# Patient Record
Sex: Female | Born: 1960 | Race: Black or African American | Hispanic: No | Marital: Married | Smoking: Current every day smoker
Health system: Southern US, Community
[De-identification: ages and names within clinical notes are randomized; demographics above are authoritative.]

## PROBLEM LIST (undated history)

## (undated) DIAGNOSIS — I10 Essential (primary) hypertension: Secondary | ICD-10-CM

## (undated) DIAGNOSIS — J45909 Unspecified asthma, uncomplicated: Secondary | ICD-10-CM

## (undated) DIAGNOSIS — E119 Type 2 diabetes mellitus without complications: Secondary | ICD-10-CM

## (undated) DIAGNOSIS — K219 Gastro-esophageal reflux disease without esophagitis: Secondary | ICD-10-CM

## (undated) DIAGNOSIS — J449 Chronic obstructive pulmonary disease, unspecified: Secondary | ICD-10-CM

## (undated) HISTORY — PX: ANKLE SURGERY: SHX546

## (undated) HISTORY — PX: DENTAL SURGERY: SHX609

---

## 2004-10-11 ENCOUNTER — Emergency Department: Payer: Self-pay | Admitting: Emergency Medicine

## 2006-02-23 ENCOUNTER — Other Ambulatory Visit: Payer: Self-pay

## 2006-02-23 ENCOUNTER — Inpatient Hospital Stay: Payer: Self-pay | Admitting: Internal Medicine

## 2006-06-13 ENCOUNTER — Emergency Department: Payer: Self-pay | Admitting: Emergency Medicine

## 2009-10-28 ENCOUNTER — Emergency Department: Payer: Self-pay | Admitting: Emergency Medicine

## 2011-01-18 ENCOUNTER — Emergency Department: Payer: Self-pay | Admitting: Emergency Medicine

## 2011-06-03 ENCOUNTER — Emergency Department: Payer: Self-pay | Admitting: Emergency Medicine

## 2012-04-18 LAB — CBC WITH DIFFERENTIAL/PLATELET
Basophil #: 0 10*3/uL (ref 0.0–0.1)
Basophil %: 0.3 %
Eosinophil #: 0.2 10*3/uL (ref 0.0–0.7)
Eosinophil %: 2.8 %
HCT: 44.9 % (ref 35.0–47.0)
HGB: 14.7 g/dL (ref 12.0–16.0)
Lymphocyte #: 1.8 10*3/uL (ref 1.0–3.6)
Lymphocyte %: 30.6 %
MCHC: 32.6 g/dL (ref 32.0–36.0)
MCV: 101 fL — ABNORMAL HIGH (ref 80–100)
Neutrophil #: 3.5 10*3/uL (ref 1.4–6.5)
Neutrophil %: 60.6 %
Platelet: 204 10*3/uL (ref 150–440)
RDW: 13.7 % (ref 11.5–14.5)
WBC: 5.8 10*3/uL (ref 3.6–11.0)

## 2012-04-18 LAB — DRUG SCREEN, URINE
Amphetamines, Ur Screen: NEGATIVE (ref ?–1000)
Barbiturates, Ur Screen: NEGATIVE (ref ?–200)
Benzodiazepine, Ur Scrn: NEGATIVE (ref ?–200)
Cannabinoid 50 Ng, Ur ~~LOC~~: NEGATIVE (ref ?–50)
Cocaine Metabolite,Ur ~~LOC~~: POSITIVE (ref ?–300)
Opiate, Ur Screen: NEGATIVE (ref ?–300)
Tricyclic, Ur Screen: NEGATIVE (ref ?–1000)

## 2012-04-18 LAB — HEMOGLOBIN A1C: Hemoglobin A1C: 6.4 % — ABNORMAL HIGH (ref 4.2–6.3)

## 2012-04-18 LAB — BASIC METABOLIC PANEL
Anion Gap: 8 (ref 7–16)
BUN: 9 mg/dL (ref 7–18)
Calcium, Total: 8.5 mg/dL (ref 8.5–10.1)
Co2: 24 mmol/L (ref 21–32)
Creatinine: 0.98 mg/dL (ref 0.60–1.30)
EGFR (African American): >60
EGFR (Non-African Amer.): >60
Glucose: 93 mg/dL (ref 65–99)
Potassium: 3.7 mmol/L (ref 3.5–5.1)

## 2012-04-18 LAB — URINALYSIS, COMPLETE
Glucose,UR: NEGATIVE mg/dL (ref 0–75)
Ketone: NEGATIVE
Squamous Epithelial: 3
WBC UR: 1 /HPF (ref 0–5)

## 2012-04-18 LAB — HEPATIC FUNCTION PANEL A (ARMC)
Albumin: 3.1 g/dL — ABNORMAL LOW (ref 3.4–5.0)
Alkaline Phosphatase: 86 U/L (ref 50–136)
SGPT (ALT): 18 U/L

## 2012-04-18 LAB — TROPONIN I: Troponin-I: <0.02 ng/mL

## 2012-04-18 LAB — CK TOTAL AND CKMB (NOT AT ARMC): CK-MB: 0.9 ng/mL (ref 0.5–3.6)

## 2012-04-18 LAB — TSH: Thyroid Stimulating Horm: 0.781 u[IU]/mL

## 2012-04-19 ENCOUNTER — Inpatient Hospital Stay: Payer: Self-pay | Admitting: Student

## 2012-04-19 LAB — CBC WITH DIFFERENTIAL/PLATELET
Basophil #: 0 10*3/uL (ref 0.0–0.1)
Eosinophil %: 2.1 %
HCT: 44.8 % (ref 35.0–47.0)
HGB: 14.3 g/dL (ref 12.0–16.0)
Lymphocyte #: 2.2 10*3/uL (ref 1.0–3.6)
Lymphocyte %: 20.9 %
MCH: 32.5 pg (ref 26.0–34.0)
Monocyte #: 0.9 x10 3/mm (ref 0.2–0.9)
Monocyte %: 8.1 %
Neutrophil #: 7.2 10*3/uL — ABNORMAL HIGH (ref 1.4–6.5)
Neutrophil %: 68.6 %
Platelet: 200 10*3/uL (ref 150–440)
RDW: 13.6 % (ref 11.5–14.5)
WBC: 10.5 10*3/uL (ref 3.6–11.0)

## 2012-04-19 LAB — LIPID PANEL
Cholesterol: 204 mg/dL — ABNORMAL HIGH (ref 0–200)
HDL Cholesterol: 44 mg/dL (ref 40–60)
Ldl Cholesterol, Calc: 136 mg/dL — ABNORMAL HIGH (ref 0–100)
Triglycerides: 119 mg/dL (ref 0–200)
VLDL Cholesterol, Calc: 24 mg/dL (ref 5–40)

## 2012-04-19 LAB — BASIC METABOLIC PANEL
BUN: 11 mg/dL (ref 7–18)
Chloride: 107 mmol/L (ref 98–107)
Co2: 27 mmol/L (ref 21–32)
Creatinine: 0.94 mg/dL (ref 0.60–1.30)
Potassium: 4.5 mmol/L (ref 3.5–5.1)

## 2012-04-22 LAB — WOUND AEROBIC CULTURE

## 2015-04-13 NOTE — H&P (Signed)
PATIENT NAME:  Aimee Buck, Aimee Buck MR#:  [id number] DATE OF BIRTH:  03/04/1961  DATE OF ADMISSION:  04/18/2012  PRIMARY CARE PHYSICIAN: Casilda Carls, MD (although the patient has not seen Dr. Rosario Jacks for more than a year)  HISTORY OF PRESENT ILLNESS: The patient is a 54 year old African American female with past medical history significant for history of hypertension, history of osteoarthritis, and history of obesity who presented to the hospital with complaints of right upper extremity swelling. According to the patient, she was doing well up until yesterday when she started having swelling and tightness in her right hand. She denies any kind of injury, but admits of scratching her right hand because of itching, in the dorsal aspect of her hand. Today however her tightness as well as swelling and redness extended to her arm. She denies any fevers, but admits of chills for the past one week. On arrival to the emergency room, she was noted to have a swollen hand, in the dorsal aspect of her hand, with weeping wounds as well as thickened skin and bullous, and hospitalist services were contacted for admission.   PAST MEDICAL HISTORY:  1. History of hypertension. 2. History of osteoarthritis in knees. 3. Admitted apparently in 2007 for overdose with acetaminophen, also history of hypokalemia and hyperglycemia. Also at that time she also abused cocaine as well as alcohol and tobacco.  4. History of depression.   MEDICATIONS: None.   ALLERGIES: Penicillin.  PAST SURGICAL HISTORY: Surgery of left ankle with three screws placed approximately 25 to 30 years ago.  FAMILY HISTORY: Diabetes mellitus as well as osteoarthritis in the patient's mother. The patient's father died at the age of 69 to 51 of massive myocardial infarction. History of unknown cancer in the patient's maternal grandmother as well as great-grandmother.   SOCIAL HISTORY: The patient is married and has three children. She lives with her  husband. She has smoked one pack per day for more than 25 years. She drinks alcohol, approximately 6 beers a week. 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  REVIEW OF SYSTEMS: Positive for feeling chilly, fatigue and weak for the past few days, itching in her hands and swelling as well as redness of her hands, weeping wounds in her hand as well as arm, redness of her arm, nausea and vomiting Sunday morning, and gastroesophageal reflux disease. CONSTITUTIONAL: Denies any fevers, pain, weight gain or weight loss. EYES: Denies blurry vision, double vision, glaucoma, or cataracts. ABDOMEN: Denies tinnitus, allergies, epistaxis, sinus pain, dentures or difficulty swallowing. RESPIRATORY: Denies any wheezing, asthma or chronic obstructive pulmonary disease. CARDIOVASCULAR: Denies chest pain, orthopnea, edema, arrhythmias, palpitations, or syncope. GASTROINTESTINAL: Denies any diarrhea, abdominal pain, rectal bleeding, or change in bowel habits. GENITOURINARY: Denies any dysuria, hematuria, frequency, or incontinence. ENDOCRINOLOGY: Denies any polydipsia, nocturia, thyroid problems, heat or cold intolerance or thirst. HEMATOLOGIC: Denies any anemia, bruising, bleeding or swollen glands. SKIN: Denies acne, rashes, lesions, or change in moles. MUSCULOSKELETAL: Denies arthritis, cramps, swelling, or gout. NEUROLOGIC: Denies numbness, epilepsy, or tremor. PSYCHIATRIC: Denies anxiety, insomnia, or depression.   PHYSICAL EXAMINATION:   VITALS: On arrival to the hospital, her temperature was 98.3, pulse 121, respiratory rate 22, and blood pressure 177/111. Oxygenation was not checked.   GENERAL:  An obese African American female in no significant distress lying on the stretcher eating crackers and peanut butter.  HEENT: Pupils are equally round and reactive to light and accommodation. Extraocular movements are intact. No icterus or conjunctivitis. Has normal  hearing. No pharyngeal erythema. Mucosa is moist.    NECK: No masses. Supple and nontender. Thyroid is not enlarged. No adenopathy. No JVD or carotid bruits bilaterally. Full range of motion.   LUNGS: Clear to auscultation in all fields. No rales, rhonchi, diminished breath sounds or labored breathing, increased effort, dullness to percussion, or overt respiratory distress.   HEART: S1 and S2 appreciated. No murmurs, gallops, or rubs noted. PMI not lateralized. Chest is nontender to palpation.   EXTREMITIES: 1+ pedal pulses. No lower extremity edema, calf tenderness, or cyanosis.   ABDOMEN: Soft and nontender. Bowel sounds are present. No hepatomegaly or masses were noted.   RECTAL: Deferred.   MUSCULOSKELETAL: Able to move all extremities. No cyanosis, degenerative joint disease, or kyphosis. Gait was not tested.  SKIN: Right upper extremity was swollen, it is erythematous and blotchy. There is an erythematous rash going into her axillary area. She has swelling and increased warmth as well as drainage and thickness of the dorsal aspect of her hand. Also serous bullous and serous drainage. She had an ulcer on the right buttock as well as some scratch marks ans well as erythema and scratch marks on the left shoulder area posteriorly. Skin otherwise did not show any rashes, lesions, erythema, nodularity, or induration. It was warm and dry to palpation. The patient had some brownish discoloration and chronic scratch areas.   NEUROLOGICAL: Cranial nerves grossly intact. Sensory is intact. No dysarthria or aphasia.   PSYCH: The patient is alert and oriented to time, person, and place. Cooperative. Memory is good. No significant confusion, agitation, or depression.   LABS/STUDIES: BMP is within normal limits. CBC is within normal limits except for MCV is high at 101.  ASSESSMENT AND PLAN:  1. Right upper extremity cellulitis: Admit the patient to the medical floor. Get wound cultures. Start on clindamycin. Follow the patient's clinical status. Add  dose of Benadryl for itching.  2. Malignant hypertension: Start Norvasc. Follow blood pressure readings.  3. Obesity: Get hemoglobin A1c as well as TSH and lipid panel.  4. History of alcohol abuse: Watch for withdrawal.  5. History of tobacco abuse: Discussed with the patient for 5 minutes. She refuses any replacement therapy.   6. Elevated MCV: Get folic acid as well as B84 levels. Supplement vitamin B12 as well as thiamine because of history of alcohol abuse.   TIME SPENT: One hour. ____________________________ Theodoro Grist, MD rv:slb D: 04/18/2012 11:00:25 ET T: 04/18/2012 11:33:43 ET JOB#: [id number]  cc: Theodoro Grist, MD, <Dictator> Isla Pence, MD [place] MD ELECTRONICALLY SIGNED 04/29/2012 14:17

## 2015-04-13 NOTE — Discharge Summary (Signed)
PATIENT NAME:  Aimee Buck, Aimee Buck MR#:  [id number] DATE OF BIRTH:  Oct 14, 1961  DATE OF ADMISSION:  04/19/2012 DATE OF DISCHARGE:  04/20/2012  PRIMARY CARE PHYSICIAN: Dr. Rosario Jacks  CHIEF COMPLAINT: Right hand pain and swelling.   DISCHARGE DIAGNOSES:  1. Right had cellulitis. 2. Hypertension. 3. History of polysubstance abuse. 4. Gastroesophageal reflux disease.   DISCHARGE MEDICATIONS:  1. Clindamycin 300 mg p.o. every six hours for seven days.  2. Amlodipine 10 mg daily.  3. Lisinopril 10 mg daily.  4. Zantac 150 mg p.o. b.i.d.   DIET: Low sodium, ADA diet.   ACTIVITY: As tolerated.   FOLLOW UP: Please follow up with your PCP in about a week.   DISPOSITION: Home.   HISTORY OF PRESENT ILLNESS: For full details, please see the history and physical dictated by Dr. Ether Griffins on 04/18/2012, but briefly this is a 54 year old African American female with history of hypertension, osteoarthritis who presented with right hand cellulitis. She was admitted to the hospitalist service. She was noted to have swollen, erythematous, weeping hand with some bulla. She was started on clindamycin, admitted to the hospitalist service.   LABORATORY, DIAGNOSTIC AND RADIOLOGICAL DATA: BUN on arrival 9, creatinine 0.98, glucose 93, hemoglobin A1c 6.4. Urine toxicology positive for cocaine. Troponin negative. TSH 0.781. WBC on arrival 5.8. Wound culture showing rare staph aureus and coag-negative staph. Urinalysis not suggestive of infection. Vitamin B12 level within normal limits.   HOSPITAL COURSE: She was admitted to the hospitalist service and started on IV clindamycin as she has allergy to penicillin. Patient stated that she was in her garden and possibly had an insect bite and afterwards she was itching and developed redness and cellulitis. She did have an area of cellulitis on the dorsum of her hand with significant edema, warmth and tenderness. Initially some drainage was noted. Blood cultures were not  sent while in the ED, however, wound cultures were sent from a superficial swab, however, I don't know the reliability of cultures given that it was superficial skin culture. It is showing coag-negative staph and staph aureus, however, patient has clinically dramatically improved with decreased edema, tenderness and erythema. She will be discharged with an additional seven days of clindamycin p.o. Patient also did have elevated blood pressure on arrival, however, does not take any blood pressure medications at home. She has history of noncompliance. She was started on amlodipine as well as lisinopril and currently blood pressures were stable. At this point she will be discharged to home with above medicine. She was told to follow up with her primary care physician and if she has worsening symptoms including pain, swelling, drainage, fevers please call your primary care physician right away and she verbalized understanding.   TOTAL TIME SPENT: 35 minutes.   CODE STATUS: Patient is FULL CODE.   ____________________________ Vivien Presto, MD sa:cms D: 04/20/2012 11:03:50 ET T: 04/21/2012 10:05:22 ET JOB#: [id number]  cc: Vivien Presto, MD, <Dictator> Isla Pence, MD Vivien Presto MD ELECTRONICALLY SIGNED 05/03/2012 17:55

## 2016-10-14 ENCOUNTER — Inpatient Hospital Stay
Admission: EM | Admit: 2016-10-14 | Discharge: 2016-10-16 | DRG: 638 | Disposition: A | Discharge status: Home / Self Care | Payer: Self-pay | Attending: Internal Medicine | Admitting: Internal Medicine

## 2016-10-14 ENCOUNTER — Encounter: Payer: Self-pay | Admitting: Emergency Medicine

## 2016-10-14 DIAGNOSIS — E1165 Type 2 diabetes mellitus with hyperglycemia: Principal | ICD-10-CM | POA: Diagnosis present

## 2016-10-14 DIAGNOSIS — Z79899 Other long term (current) drug therapy: Secondary | ICD-10-CM

## 2016-10-14 DIAGNOSIS — Z6841 Body Mass Index (BMI) 40.0 and over, adult: Secondary | ICD-10-CM

## 2016-10-14 DIAGNOSIS — J45909 Unspecified asthma, uncomplicated: Secondary | ICD-10-CM | POA: Diagnosis present

## 2016-10-14 DIAGNOSIS — N3001 Acute cystitis with hematuria: Secondary | ICD-10-CM | POA: Diagnosis present

## 2016-10-14 DIAGNOSIS — R739 Hyperglycemia, unspecified: Secondary | ICD-10-CM | POA: Diagnosis present

## 2016-10-14 DIAGNOSIS — I10 Essential (primary) hypertension: Secondary | ICD-10-CM | POA: Diagnosis present

## 2016-10-14 DIAGNOSIS — E871 Hypo-osmolality and hyponatremia: Secondary | ICD-10-CM | POA: Diagnosis present

## 2016-10-14 DIAGNOSIS — Z23 Encounter for immunization: Secondary | ICD-10-CM

## 2016-10-14 DIAGNOSIS — F17210 Nicotine dependence, cigarettes, uncomplicated: Secondary | ICD-10-CM | POA: Diagnosis present

## 2016-10-14 DIAGNOSIS — N179 Acute kidney failure, unspecified: Secondary | ICD-10-CM | POA: Diagnosis present

## 2016-10-14 HISTORY — DX: Unspecified asthma, uncomplicated: J45.909

## 2016-10-14 HISTORY — DX: Essential (primary) hypertension: I10

## 2016-10-14 LAB — CBC
HCT: 45.1 % (ref 35.0–47.0)
HEMOGLOBIN: 15.2 g/dL (ref 12.0–16.0)
MCH: 34.2 pg — AB (ref 26.0–34.0)
MCHC: 33.8 g/dL (ref 32.0–36.0)
MCV: 101.1 fL — ABNORMAL HIGH (ref 80.0–100.0)
Platelets: 216 10*3/uL (ref 150–440)
RBC: 4.46 MIL/uL (ref 3.80–5.20)
RDW: 13.1 % (ref 11.5–14.5)
WBC: 6.4 10*3/uL (ref 3.6–11.0)

## 2016-10-14 LAB — URINALYSIS COMPLETE WITH MICROSCOPIC (ARMC ONLY)
Bilirubin Urine: NEGATIVE
Glucose, UA: >500 mg/dL — AB
KETONES UR: NEGATIVE mg/dL
NITRITE: POSITIVE — AB
PH: 6 (ref 5.0–8.0)
PROTEIN: NEGATIVE mg/dL
SPECIFIC GRAVITY, URINE: 1.023 (ref 1.005–1.030)

## 2016-10-14 LAB — TSH: TSH: 1.105 u[IU]/mL (ref 0.350–4.500)

## 2016-10-14 LAB — GLUCOSE, CAPILLARY
GLUCOSE-CAPILLARY: 209 mg/dL — AB (ref 65–99)
GLUCOSE-CAPILLARY: 248 mg/dL — AB (ref 65–99)
GLUCOSE-CAPILLARY: 473 mg/dL — AB (ref 65–99)
GLUCOSE-CAPILLARY: 525 mg/dL — AB (ref 65–99)
Glucose-Capillary: 569 mg/dL (ref 65–99)
Glucose-Capillary: 577 mg/dL (ref 65–99)
Glucose-Capillary: 427 mg/dL — ABNORMAL HIGH (ref 65–99)
Glucose-Capillary: 117 mg/dL — ABNORMAL HIGH (ref 65–99)

## 2016-10-14 LAB — MRSA PCR SCREENING: MRSA BY PCR: NEGATIVE

## 2016-10-14 LAB — BASIC METABOLIC PANEL
ANION GAP: 12 (ref 5–15)
ANION GAP: 10 (ref 5–15)
BUN: 29 mg/dL — ABNORMAL HIGH (ref 6–20)
BUN: 27 mg/dL — AB (ref 6–20)
CALCIUM: 9 mg/dL (ref 8.9–10.3)
CALCIUM: 8.7 mg/dL — AB (ref 8.9–10.3)
CO2: 24 mmol/L (ref 22–32)
CO2: 25 mmol/L (ref 22–32)
CREATININE: 2.16 mg/dL — AB (ref 0.44–1.00)
Chloride: 85 mmol/L — ABNORMAL LOW (ref 101–111)
Chloride: 94 mmol/L — ABNORMAL LOW (ref 101–111)
Creatinine, Ser: 2.61 mg/dL — ABNORMAL HIGH (ref 0.44–1.00)
GFR calc Af Amer: 29 mL/min — ABNORMAL LOW (ref >60)
GFR, EST AFRICAN AMERICAN: 23 mL/min — AB (ref >60)
GFR, EST NON AFRICAN AMERICAN: 20 mL/min — AB (ref >60)
GFR, EST NON AFRICAN AMERICAN: 25 mL/min — AB (ref >60)
GLUCOSE: 451 mg/dL — AB (ref 65–99)
Glucose, Bld: 878 mg/dL (ref 65–99)
Potassium: 4.8 mmol/L (ref 3.5–5.1)
Potassium: 3.8 mmol/L (ref 3.5–5.1)
Sodium: 121 mmol/L — ABNORMAL LOW (ref 135–145)
Sodium: 129 mmol/L — ABNORMAL LOW (ref 135–145)

## 2016-10-14 LAB — HEPATIC FUNCTION PANEL
ALBUMIN: 4 g/dL (ref 3.5–5.0)
ALT: 17 U/L (ref 14–54)
AST: 20 U/L (ref 15–41)
Alkaline Phosphatase: 174 U/L — ABNORMAL HIGH (ref 38–126)
BILIRUBIN TOTAL: 0.4 mg/dL (ref 0.3–1.2)
Bilirubin, Direct: 0.1 mg/dL (ref 0.1–0.5)
Indirect Bilirubin: 0.3 mg/dL (ref 0.3–0.9)
Total Protein: 7.6 g/dL (ref 6.5–8.1)

## 2016-10-14 LAB — LIPASE, BLOOD: Lipase: 58 U/L — ABNORMAL HIGH (ref 11–51)

## 2016-10-14 MED ORDER — CEFTRIAXONE SODIUM-DEXTROSE 1-3.74 GM-% IV SOLR
1.0000 g | Freq: Once | INTRAVENOUS | Status: AC
  Administered 2016-10-14: 1 g via INTRAVENOUS
  Filled 2016-10-14: qty 50

## 2016-10-14 MED ORDER — INSULIN REGULAR BOLUS VIA INFUSION
0.0000 [IU] | Freq: Three times a day (TID) | INTRAVENOUS | Status: DC
  Filled 2016-10-14: qty 10

## 2016-10-14 MED ORDER — DEXTROSE 50 % IV SOLN: 25.0000 mL | INTRAVENOUS | Status: DC | PRN

## 2016-10-14 MED ORDER — ONDANSETRON HCL 4 MG/2ML IJ SOLN: 4.0000 mg | Freq: Four times a day (QID) | INTRAMUSCULAR | Status: DC | PRN

## 2016-10-14 MED ORDER — SODIUM CHLORIDE 0.9 % IV BOLUS (SEPSIS)
1000.0000 mL | Freq: Once | INTRAVENOUS | Status: AC
  Administered 2016-10-14: 1000 mL via INTRAVENOUS

## 2016-10-14 MED ORDER — ACETAMINOPHEN 650 MG RE SUPP: 650.0000 mg | Freq: Four times a day (QID) | RECTAL | Status: DC | PRN

## 2016-10-14 MED ORDER — ONDANSETRON HCL 4 MG PO TABS: 4.0000 mg | ORAL_TABLET | Freq: Four times a day (QID) | ORAL | Status: DC | PRN

## 2016-10-14 MED ORDER — INSULIN ASPART 100 UNIT/ML ~~LOC~~ SOLN
10.0000 [IU] | Freq: Once | SUBCUTANEOUS | Status: DC
  Filled 2016-10-14: qty 10

## 2016-10-14 MED ORDER — SODIUM CHLORIDE 0.9 % IV SOLN: INTRAVENOUS | Status: DC

## 2016-10-14 MED ORDER — SODIUM CHLORIDE 0.9 % IV SOLN
INTRAVENOUS | Status: DC
  Administered 2016-10-14 – 2016-10-15 (×2): via INTRAVENOUS

## 2016-10-14 MED ORDER — GABAPENTIN 100 MG PO CAPS
200.0000 mg | ORAL_CAPSULE | Freq: Two times a day (BID) | ORAL | Status: DC
  Administered 2016-10-14 – 2016-10-16 (×4): 200 mg via ORAL
  Filled 2016-10-14 (×4): qty 2

## 2016-10-14 MED ORDER — CEFTRIAXONE SODIUM-DEXTROSE 1-3.74 GM-% IV SOLR
1.0000 g | INTRAVENOUS | Status: DC
  Filled 2016-10-14: qty 50

## 2016-10-14 MED ORDER — SODIUM CHLORIDE 0.9 % IV SOLN
INTRAVENOUS | Status: DC
  Filled 2016-10-14: qty 2.5

## 2016-10-14 MED ORDER — INFLUENZA VAC SPLIT QUAD 0.5 ML IM SUSY
0.5000 mL | PREFILLED_SYRINGE | INTRAMUSCULAR | Status: AC
  Administered 2016-10-15: 0.5 mL via INTRAMUSCULAR
  Filled 2016-10-14: qty 0.5

## 2016-10-14 MED ORDER — DEXTROSE-NACL 5-0.45 % IV SOLN
INTRAVENOUS | Status: DC
  Administered 2016-10-14 – 2016-10-15 (×2): via INTRAVENOUS

## 2016-10-14 MED ORDER — SODIUM CHLORIDE 0.9% FLUSH
3.0000 mL | Freq: Two times a day (BID) | INTRAVENOUS | Status: DC
  Administered 2016-10-14 – 2016-10-16 (×4): 3 mL via INTRAVENOUS

## 2016-10-14 MED ORDER — INSULIN ASPART 100 UNIT/ML ~~LOC~~ SOLN
10.0000 [IU] | Freq: Once | SUBCUTANEOUS | Status: AC
  Administered 2016-10-14: 10 [IU] via INTRAVENOUS

## 2016-10-14 MED ORDER — ACETAMINOPHEN 325 MG PO TABS
650.0000 mg | ORAL_TABLET | Freq: Four times a day (QID) | ORAL | Status: DC | PRN
Start: 2016-10-14 — End: 2016-10-16

## 2016-10-14 MED ORDER — INSULIN REGULAR BOLUS VIA INFUSION
0.0000 [IU] | Freq: Three times a day (TID) | INTRAVENOUS | Status: DC
  Administered 2016-10-15: 0.3 [IU] via INTRAVENOUS
  Administered 2016-10-15: 5 [IU] via INTRAVENOUS
  Filled 2016-10-14: qty 10

## 2016-10-14 MED ORDER — DEXTROSE 5 % IV SOLN: 1.0000 g | Freq: Once | INTRAVENOUS | Status: DC

## 2016-10-14 MED ORDER — INSULIN REGULAR HUMAN 100 UNIT/ML IJ SOLN
INTRAMUSCULAR | Status: DC
  Administered 2016-10-14: 4.7 [IU]/h via INTRAVENOUS
  Administered 2016-10-14: 13.5 [IU]/h via INTRAVENOUS
  Administered 2016-10-15: 2.1 [IU]/h via INTRAVENOUS
  Administered 2016-10-15: 19 [IU]/h via INTRAVENOUS
  Administered 2016-10-15: 3 [IU]/h via INTRAVENOUS
  Filled 2016-10-14 (×2): qty 2.5

## 2016-10-14 MED ORDER — CEPHALEXIN 500 MG PO CAPS: 500.0000 mg | ORAL_CAPSULE | Freq: Four times a day (QID) | ORAL | 0 refills | Status: DC

## 2016-10-14 MED ORDER — ENOXAPARIN SODIUM 40 MG/0.4ML ~~LOC~~ SOLN
40.0000 mg | SUBCUTANEOUS | Status: DC
  Administered 2016-10-14 – 2016-10-15 (×2): 40 mg via SUBCUTANEOUS
  Filled 2016-10-14 (×2): qty 0.4

## 2016-10-14 MED ORDER — CEPHALEXIN 500 MG PO CAPS: 500.0000 mg | ORAL_CAPSULE | Freq: Once | ORAL | Status: DC

## 2016-10-14 NOTE — ED Triage Notes (Signed)
Pt to ED c/o urinary frequency, burning, orange tinged, and swollen genitalia x2 weeks.  Pt taking OTC Phenazopyridine.  Pt c/o hyperglycemia and dizziness since Monday.  Pt states feels "like I'm going to pass out".  Pt A&Ox4, speaking in complete and coherent sentences, chest rise even and unlabored.  Pt BP taken in triage left arm 95/50, 101/65 taken multiple times and right arm 99/62.

## 2016-10-14 NOTE — H&P (Signed)
[facility] [place] NAME: Aimee Buck    MR#:  [id number]  DATE OF BIRTH:  [birth year]  DATE OF ADMISSION:  10/14/2016  PRIMARY CARE PHYSICIAN: Casilda Carls, MD   REQUESTING/REFERRING PHYSICIAN: Dr. Merlyn Lot  CHIEF COMPLAINT:   Chief Complaint  Patient presents with  . Urinary Frequency  . Dizziness    HISTORY OF PRESENT ILLNESS:  Aimee Buck  is a 55 y.o. female with a known history of asthma, hypertension comes to hospital with 2 week history of urinary frequency and dysuria.  Patient does not have a PCP and hasn't been taking care of herself due to financial limitations. She had the symptoms for almost 2 weeks but didn't want to come to ER due to the same. However symptoms were getting worse, she now has chills, nausea, generalized weakness, fatigue and came over. Blood sugars were >800, noted to have acute renal failure and UTI. Has been having fatigue, polyuria, polydipsia for a few months now. Initially did not want to get admitted. Wanted to get just antibiotic prescription and wanted to leave. After explaining the risks and complications, agreed to stay for a day only.  PAST MEDICAL HISTORY:   Past Medical History:  Diagnosis Date  . Asthma   . Hypertension     PAST SURGICAL HISTORY:   Past Surgical History:  Procedure Laterality Date  . ANKLE SURGERY Left     SOCIAL HISTORY:   Social History  Substance Use Topics  . Smoking status: Current Every Day Smoker    Packs/day: 0.50    Types: Cigarettes  . Smokeless tobacco: Never Used  . Alcohol use 8.4 oz/week    14 Cans of beer per week    FAMILY HISTORY:   Family History  Problem Relation Age of Onset  . Hypertension Mother   . Hypertension Father   . CAD Father     DRUG ALLERGIES:  No Known Allergies  REVIEW OF SYSTEMS:   Review of Systems  Constitutional: Positive for chills and malaise/fatigue. Negative for fever and weight  loss.  HENT: Negative for ear discharge, ear pain, hearing loss, nosebleeds and tinnitus.   Eyes: Negative for blurred vision, double vision and photophobia.  Respiratory: Negative for cough, hemoptysis, shortness of breath and wheezing.   Cardiovascular: Negative for chest pain, palpitations, orthopnea and leg swelling.  Gastrointestinal: Positive for nausea. Negative for abdominal pain, constipation, diarrhea, heartburn, melena and vomiting.  Genitourinary: Positive for dysuria, frequency and urgency. Negative for hematuria.  Musculoskeletal: Positive for back pain. Negative for myalgias and neck pain.  Skin: Negative for rash.  Neurological: Negative for dizziness, tingling, tremors, sensory change, speech change, focal weakness and headaches.  Endo/Heme/Allergies: Does not bruise/bleed easily.  Psychiatric/Behavioral: Negative for depression.    MEDICATIONS AT HOME:   Prior to Admission medications   Medication Sig Start Date End Date Taking? Authorizing Provider  cyclobenzaprine (FLEXERIL) 10 MG tablet Take 10 mg by mouth 3 (three) times daily as needed for muscle spasms.   Yes Historical Provider, MD  hydrochlorothiazide (HYDRODIURIL) 25 MG tablet Take 25 mg by mouth daily.   Yes Historical Provider, MD  lisinopril (PRINIVIL,ZESTRIL) 20 MG tablet Take 20 mg by mouth daily.   Yes Historical Provider, MD  naproxen (NAPROSYN) 375 MG tablet Take 375 mg by mouth 2 (two) times daily with a meal.   Yes Historical Provider, MD  cephALEXin (KEFLEX) 500 MG capsule Take 1 capsule (500 mg total) by mouth  4 (four) times daily. 10/14/16 10/24/16  Merlyn Lot, MD      VITAL SIGNS:  Blood pressure 103/64, pulse 100, temperature 97.5 F (36.4 C), temperature source Oral, resp. rate 20, height 5\' 6"  (1.676 m), weight 106.6 kg (235 lb), SpO2 97 %.  PHYSICAL EXAMINATION:   Physical Exam  GENERAL:  55 y.o.-year-old obese patient lying in the bed with no acute distress.  EYES: Pupils equal,  round, reactive to light and accommodation. No scleral icterus. Extraocular muscles intact. Conjunctival erythema noted. HEENT: Head atraumatic, normocephalic. Oropharynx and nasopharynx clear.  NECK:  Supple, no jugular venous distention. No thyroid enlargement, no tenderness.  LUNGS: Normal breath sounds bilaterally, no wheezing, rales,rhonchi or crepitation. No use of accessory muscles of respiration.  CARDIOVASCULAR: S1, S2 normal. No murmurs, rubs, or gallops.  ABDOMEN: Soft, nontender, nondistended. Bowel sounds present. No organomegaly or mass.  EXTREMITIES: No pedal edema, cyanosis, or clubbing.  NEUROLOGIC: Cranial nerves II through XII are intact. Muscle strength 5/5 in all extremities. Sensation intact. Gait not checked.  PSYCHIATRIC: The patient is alert and oriented x 3.  SKIN: No obvious rash, lesion, or ulcer.   LABORATORY PANEL:   CBC  Recent Labs Lab 10/14/16 1246  WBC 6.4  HGB 15.2  HCT 45.1  PLT 216   ------------------------------------------------------------------------------------------------------------------  Chemistries   Recent Labs Lab 10/14/16 1246  NA 121*  K 4.8  CL 85*  CO2 24  GLUCOSE 878*  BUN 29*  CREATININE 2.61*  CALCIUM 9.0  AST 20  ALT 17  ALKPHOS 174*  BILITOT 0.4   ------------------------------------------------------------------------------------------------------------------  Cardiac Enzymes No results for input(s): TROPONINI in the last 168 hours. ------------------------------------------------------------------------------------------------------------------  RADIOLOGY:  No results found.  EKG:   Orders placed or performed in visit on 04/19/12  . EKG 12-Lead    IMPRESSION AND PLAN:   Aimee Buck  is a 55 y.o. female with a known history of asthma, hypertension comes to hospital with 2 week history of urinary frequency and dysuria.   #1 New onset diabetes with non ketotic hyperglycemia- blood sugars  >800, started on IV insulin drip - check A1c, carbohydrate controlled diet -dietitian consult  #2 ARF- ATN, IV fluids, renal US and monitor Recheck labs later tonight  #3 Hyponatremia- pseudohyponatremia- from elevated sugars - IV NS  #4 Acute cystitis- urine and blood cultures -IV rocephin  #5 Hypertension- hold meds as low normal BP, likely from infection Continue IV fluids  #6 DVT Prophylaxis- lovenox   Care management consult for financial and medication help.    All the records are reviewed and case discussed with ED provider. Management plans discussed with the patient, family and they are in agreement.  CODE STATUS: Full Code  TOTAL CRITICAL CARE TIME TAKING CARE OF THIS PATIENT: 55 minutes.    Gladstone Lighter M.D on 10/14/2016 at 4:27 PM  Between 7am to 6pm - Pager - [phone]  After 6pm go to [email or web address] - password [email or web address] [facility]  Office  [phone]  CC: Primary care physician; Casilda Carls, MD

## 2016-10-14 NOTE — Progress Notes (Signed)
Pharmacy Antibiotic Note  Aimee Buck is a 55 y.o. female admitted on 10/14/2016 with UTI.  Pharmacy has been consulted for Ceftriaxone  Dosing. Patient received ceftriaxone 1gm IV in ED.   Plan: Will continue patient on Ceftriaxone 1gm IV every 24 hours.   Height: 5\' 6"  (167.6 cm) Weight: 235 lb (106.6 kg) IBW/kg (Calculated) : 59.3  Temp (24hrs), Avg:97.5 F (36.4 C), Min:97.5 F (36.4 C), Max:97.5 F (36.4 C)   Recent Labs Lab 10/14/16 1246  WBC 6.4  CREATININE 2.61*    Estimated Creatinine Clearance: 31.1 mL/min (by C-G formula based on SCr of 2.61 mg/dL (H)).    No Known Allergies  Antimicrobials this admission: 10/26 ceftriaxone  >>    Dose adjustments this admission:   Microbiology results:  10/26 UCx:   10/26  MRSA PCR:   Thank you for allowing pharmacy to be a part of this patient's care.  Dani Gobble Noelia Lenart 10/14/2016 7:19 PM

## 2016-10-14 NOTE — ED Provider Notes (Signed)
[facility] Emergency Department Provider Note    None    (approximate)  I have reviewed the triage vital signs and the nursing notes.   HISTORY  Chief Complaint Urinary Frequency and Dizziness    HPI Aimee Buck is a 55 y.o. female with history of asthma and hypertension presents with 2 weeks of increased urinary frequency, burning, dry mouth and polyuria. Patient states that she's been checking her blood sugar at home and has been frequently reading high. States that she's been taking Azo over-the-counter without any improvement. Denies any fevers. Does feel nauseated but no diarrhea. No chest pain or shortness of breath.   Past Medical History:  Diagnosis Date  . Asthma   . Hypertension     There are no active problems to display for this patient.   Past Surgical History:  Procedure Laterality Date  . ANKLE SURGERY Left     Prior to Admission medications   Not on File    Allergies Review of patient's allergies indicates no known allergies.  History reviewed. No pertinent family history.  Social History Social History  Substance Use Topics  . Smoking status: Current Every Day Smoker    Packs/day: 0.50    Types: Cigarettes  . Smokeless tobacco: Never Used  . Alcohol use 8.4 oz/week    14 Cans of beer per week    Review of Systems Patient denies headaches, rhinorrhea, blurry vision, numbness, shortness of breath, chest pain, edema, cough, abdominal pain, nausea, vomiting, diarrhea, dysuria, fevers, rashes or hallucinations unless otherwise stated above in HPI. ____________________________________________   PHYSICAL EXAM:  VITAL SIGNS: Vitals:   10/14/16 1229 10/14/16 1236  BP:  101/65  Pulse: (!) 116   Resp: 20   Temp: 97.5 F (36.4 C)     Constitutional: Alert and oriented.  in no acute distress. Eyes: Conjunctivae are normal. PERRL. EOMI. Head: Atraumatic. Nose: No congestion/rhinnorhea. Mouth/Throat:  Mucous membranes are dry.  Oropharynx non-erythematous. Neck: No stridor. Painless ROM. No cervical spine tenderness to palpation Hematological/Lymphatic/Immunilogical: No cervical lymphadenopathy. Cardiovascular: Normal rate, regular rhythm. Grossly normal heart sounds.  Good peripheral circulation. Respiratory: Normal respiratory effort.  No retractions. Lungs CTAB. Gastrointestinal: Soft and nontender. No distention. No abdominal bruits. No CVA tenderness. Genitourinary:   erythema and white thick discharge consistent with candidal infection in the intertriginous region Musculoskeletal: No lower extremity tenderness nor edema.  No joint effusions. Neurologic:  Normal speech and language. No gross focal neurologic deficits are appreciated. No gait instability. Skin:  Skin is warm, dry and intact. No rash noted. Psychiatric: Mood and affect are normal. Speech and behavior are normal.  ____________________________________________   LABS (all labs ordered are listed, but only abnormal results are displayed)  Results for orders placed or performed during the hospital encounter of 10/14/16 (from the past 24 hour(s))  Basic metabolic panel     Status: Abnormal   Collection Time: 10/14/16 12:46 PM  Result Value Ref Range   Sodium 121 (L) 135 - 145 mmol/L   Potassium 4.8 3.5 - 5.1 mmol/L   Chloride 85 (L) 101 - 111 mmol/L   CO2 24 22 - 32 mmol/L   Glucose, Bld 878 (HH) 65 - 99 mg/dL   BUN 29 (H) 6 - 20 mg/dL   Creatinine, Ser 2.61 (H) 0.44 - 1.00 mg/dL   Calcium 9.0 8.9 - 10.3 mg/dL   GFR calc non Af Amer 20 (L) >60 mL/min   GFR calc Af Amer 23 (L) >60  mL/min   Anion gap 12 5 - 15  CBC     Status: Abnormal   Collection Time: 10/14/16 12:46 PM  Result Value Ref Range   WBC 6.4 3.6 - 11.0 K/uL   RBC 4.46 3.80 - 5.20 MIL/uL   Hemoglobin 15.2 12.0 - 16.0 g/dL   HCT 45.1 35.0 - 47.0 %   MCV 101.1 (H) 80.0 - 100.0 fL   MCH 34.2 (H) 26.0 - 34.0 pg   MCHC 33.8 32.0 - 36.0 g/dL   RDW 13.1  11.5 - 14.5 %   Platelets 216 150 - 440 K/uL  Hepatic function panel     Status: Abnormal   Collection Time: 10/14/16 12:46 PM  Result Value Ref Range   Total Protein 7.6 6.5 - 8.1 g/dL   Albumin 4.0 3.5 - 5.0 g/dL   AST 20 15 - 41 U/L   ALT 17 14 - 54 U/L   Alkaline Phosphatase 174 (H) 38 - 126 U/L   Total Bilirubin 0.4 0.3 - 1.2 mg/dL   Bilirubin, Direct 0.1 0.1 - 0.5 mg/dL   Indirect Bilirubin 0.3 0.3 - 0.9 mg/dL  Lipase, blood     Status: Abnormal   Collection Time: 10/14/16 12:46 PM  Result Value Ref Range   Lipase 58 (H) 11 - 51 U/L   ____________________________________________ ____________________________________________  RADIOLOGY   ____________________________________________   PROCEDURES  Procedure(s) performed: none    Critical Care performed: no ____________________________________________   INITIAL IMPRESSION / ASSESSMENT AND PLAN / ED COURSE  Pertinent labs & imaging results that were available during my care of the patient were reviewed by me and considered in my medical decision making (see chart for details).  DDX: Dm, hhns, dka, dehydration, uti, candida  Aimee Buck is a 55 y.o. who presents to the ED with Evidence of hyperglycemia, tachycardia and a KI secondary to cystitis. Abdominal exam is reassuring. Patient given IV fluids for hyperglycemia as well as ordered IV antibiotics for her acute cystitis. I recommended admission to the hospital for further evaluation and management. Patient is refusing this.  I was called to bedside as the patient insisted on leaving AMA. I talked to the patient at length and carefully explained, in layman's terms, that the standard of care is to provide IVF, insulin, IV ABX and that by leaving against medical advice they not allowing [place] to treat their acute medical conditions according to our best medical judgement, and that a serious adverse outcome including increased pain, suffering, short- or long-term  disability and even death, could result. I also explained to the patient that we respect their point-of-view and are not angry. I made sure that they understood that they can, and should, return at any time if they change their mind. This conversation was witnessed by [employer]. The patient indicated understanding of our conversation but still desired to leave against medical advice. I deemed Aimee Buck to be of sound mind to make this decision and demonstrate understanding of our conversation.   Clinical Course     ____________________________________________   FINAL CLINICAL IMPRESSION(S) / ED DIAGNOSES  Final diagnoses:  Acute cystitis with hematuria  Hyperglycemia  AKI (acute kidney injury) ([place])      NEW MEDICATIONS STARTED DURING THIS VISIT:  New Prescriptions   No medications on file     Note:  This document was prepared using Dragon voice recognition software and may include unintentional dictation errors.    Merlyn Lot, MD 10/14/16 [phone]

## 2016-10-14 NOTE — ED Notes (Signed)
This RN to bedside at this time. Pt states she is willing to stay "but they better have [her] discharged by 1200 tomorrow or [she] is leaving". Pt assisted to the bathroom by this RN. Pt is alert and oriented at this time. Will continue to monitor for further patient needs.

## 2016-10-15 ENCOUNTER — Inpatient Hospital Stay: Payer: Self-pay

## 2016-10-15 LAB — BASIC METABOLIC PANEL
ANION GAP: 7 (ref 5–15)
BUN: 27 mg/dL — ABNORMAL HIGH (ref 6–20)
CALCIUM: 8.6 mg/dL — AB (ref 8.9–10.3)
CHLORIDE: 100 mmol/L — AB (ref 101–111)
CO2: 28 mmol/L (ref 22–32)
Creatinine, Ser: 2.04 mg/dL — ABNORMAL HIGH (ref 0.44–1.00)
GFR calc non Af Amer: 27 mL/min — ABNORMAL LOW (ref >60)
GFR, EST AFRICAN AMERICAN: 31 mL/min — AB (ref >60)
Glucose, Bld: 131 mg/dL — ABNORMAL HIGH (ref 65–99)
POTASSIUM: 4 mmol/L (ref 3.5–5.1)
Sodium: 135 mmol/L (ref 135–145)

## 2016-10-15 LAB — GLUCOSE, CAPILLARY
GLUCOSE-CAPILLARY: 129 mg/dL — AB (ref 65–99)
GLUCOSE-CAPILLARY: 183 mg/dL — AB (ref 65–99)
GLUCOSE-CAPILLARY: 202 mg/dL — AB (ref 65–99)
GLUCOSE-CAPILLARY: 211 mg/dL — AB (ref 65–99)
GLUCOSE-CAPILLARY: 323 mg/dL — AB (ref 65–99)
GLUCOSE-CAPILLARY: 358 mg/dL — AB (ref 65–99)
GLUCOSE-CAPILLARY: 370 mg/dL — AB (ref 65–99)
GLUCOSE-CAPILLARY: 392 mg/dL — AB (ref 65–99)
GLUCOSE-CAPILLARY: 399 mg/dL — AB (ref 65–99)
GLUCOSE-CAPILLARY: 410 mg/dL — AB (ref 65–99)
GLUCOSE-CAPILLARY: 567 mg/dL — AB (ref 65–99)
Glucose-Capillary: 319 mg/dL — ABNORMAL HIGH (ref 65–99)
Glucose-Capillary: 159 mg/dL — ABNORMAL HIGH (ref 65–99)
Glucose-Capillary: 113 mg/dL — ABNORMAL HIGH (ref 65–99)
Glucose-Capillary: 139 mg/dL — ABNORMAL HIGH (ref 65–99)
Glucose-Capillary: 277 mg/dL — ABNORMAL HIGH (ref 65–99)
Glucose-Capillary: 312 mg/dL — ABNORMAL HIGH (ref 65–99)
Glucose-Capillary: 349 mg/dL — ABNORMAL HIGH (ref 65–99)

## 2016-10-15 LAB — CBC
HCT: 41.7 % (ref 35.0–47.0)
HEMOGLOBIN: 13.9 g/dL (ref 12.0–16.0)
MCH: 33.6 pg (ref 26.0–34.0)
MCHC: 33.3 g/dL (ref 32.0–36.0)
MCV: 100.8 fL — AB (ref 80.0–100.0)
Platelets: 193 10*3/uL (ref 150–440)
RBC: 4.13 MIL/uL (ref 3.80–5.20)
RDW: 13.1 % (ref 11.5–14.5)
WBC: 7.3 10*3/uL (ref 3.6–11.0)

## 2016-10-15 LAB — HEMOGLOBIN A1C
HEMOGLOBIN A1C: 12.5 % — AB (ref 4.8–5.6)
Mean Plasma Glucose: 312 mg/dL

## 2016-10-15 MED ORDER — INSULIN ASPART 100 UNIT/ML ~~LOC~~ SOLN
0.0000 [IU] | SUBCUTANEOUS | Status: DC
  Administered 2016-10-15 (×3): 15 [IU] via SUBCUTANEOUS
  Administered 2016-10-16: 11 [IU] via SUBCUTANEOUS
  Administered 2016-10-16: 15 [IU] via SUBCUTANEOUS
  Administered 2016-10-16: 11 [IU] via SUBCUTANEOUS
  Filled 2016-10-15: qty 11
  Filled 2016-10-15 (×3): qty 15
  Filled 2016-10-15: qty 11
  Filled 2016-10-15: qty 15

## 2016-10-15 MED ORDER — INSULIN GLARGINE 100 UNIT/ML ~~LOC~~ SOLN
20.0000 [IU] | Freq: Every day | SUBCUTANEOUS | Status: DC
  Administered 2016-10-15: 20 [IU] via SUBCUTANEOUS
  Filled 2016-10-15: qty 0.2

## 2016-10-15 MED ORDER — INSULIN ASPART 100 UNIT/ML ~~LOC~~ SOLN: 0.0000 [IU] | Freq: Three times a day (TID) | SUBCUTANEOUS | Status: DC

## 2016-10-15 MED ORDER — LIVING WELL WITH DIABETES BOOK
Freq: Once | Status: AC
  Administered 2016-10-15: 10:00:00
  Filled 2016-10-15: qty 1

## 2016-10-15 MED ORDER — INSULIN ASPART 100 UNIT/ML ~~LOC~~ SOLN: 0.0000 [IU] | Freq: Every day | SUBCUTANEOUS | Status: DC

## 2016-10-15 MED ORDER — INSULIN GLARGINE 100 UNIT/ML ~~LOC~~ SOLN
30.0000 [IU] | Freq: Every day | SUBCUTANEOUS | Status: DC
Start: 2016-10-16 — End: 2016-10-16
  Filled 2016-10-15: qty 0.3

## 2016-10-15 MED ORDER — INSULIN ASPART 100 UNIT/ML ~~LOC~~ SOLN
0.0000 [IU] | Freq: Three times a day (TID) | SUBCUTANEOUS | Status: DC
Start: 2016-10-15 — End: 2016-10-15

## 2016-10-15 MED ORDER — INSULIN ASPART 100 UNIT/ML ~~LOC~~ SOLN
10.0000 [IU] | Freq: Three times a day (TID) | SUBCUTANEOUS | Status: DC
  Administered 2016-10-15: 10 [IU] via SUBCUTANEOUS
  Filled 2016-10-15: qty 10

## 2016-10-15 MED ORDER — INSULIN STARTER KIT- SYRINGES (ENGLISH)
1.0000 | Freq: Once | Status: AC
  Administered 2016-10-15: 1
  Filled 2016-10-15: qty 1

## 2016-10-15 MED ORDER — CEFTRIAXONE SODIUM-DEXTROSE 1-3.74 GM-% IV SOLR
1.0000 g | INTRAVENOUS | Status: DC
  Administered 2016-10-15: 1 g via INTRAVENOUS
  Filled 2016-10-15 (×2): qty 50

## 2016-10-15 MED ORDER — INSULIN ASPART 100 UNIT/ML ~~LOC~~ SOLN: 0.0000 [IU] | SUBCUTANEOUS | Status: DC

## 2016-10-15 MED ORDER — INSULIN GLARGINE 100 UNIT/ML ~~LOC~~ SOLN
20.0000 [IU] | Freq: Every day | SUBCUTANEOUS | Status: DC
  Filled 2016-10-15: qty 0.2

## 2016-10-15 MED ORDER — INSULIN ASPART 100 UNIT/ML ~~LOC~~ SOLN: 5.0000 [IU] | Freq: Three times a day (TID) | SUBCUTANEOUS | Status: DC

## 2016-10-15 MED ORDER — INSULIN GLARGINE 100 UNIT/ML ~~LOC~~ SOLN
10.0000 [IU] | Freq: Once | SUBCUTANEOUS | Status: AC
  Administered 2016-10-15: 10 [IU] via SUBCUTANEOUS
  Filled 2016-10-15: qty 0.1

## 2016-10-15 MED ORDER — ATORVASTATIN CALCIUM 20 MG PO TABS
40.0000 mg | ORAL_TABLET | Freq: Every day | ORAL | Status: DC
  Administered 2016-10-15: 40 mg via ORAL
  Filled 2016-10-15: qty 2

## 2016-10-15 NOTE — Progress Notes (Signed)
Patient asked about eating a quesadilla after dinner, this nurse explained that it was not advised since CBGs have so elevated, and to just eat dinner. When nurse entered room after dinner patient had ate quesadilla as well as dinner. Patient continually educated on what she can and cannot eat, but has been noncompliant.  Aimee Buck

## 2016-10-15 NOTE — Progress Notes (Signed)
Pharmacy Antibiotic Note  Aimee Buck is a 55 y.o. female admitted on 10/14/2016 with DKA and UTI.  Pharmacy has been consulted for Ceftriaxone  Dosing. Patient received ceftriaxone 1gm IV in ED.   Plan: Continue ceftriaxone 1gm IV every 24 hours.   Height: 5\' 6"  (167.6 cm) Weight: (!) 300 lb 7.8 oz (136.3 kg) IBW/kg (Calculated) : 59.3  Temp (24hrs), Avg:97.8 F (36.6 C), Min:97.5 F (36.4 C), Max:98.1 F (36.7 C)   Recent Labs Lab 10/14/16 1246 10/14/16 1957 10/15/16 0459  WBC 6.4  --  7.3  CREATININE 2.61* 2.16* 2.04*    Estimated Creatinine Clearance: 45.9 mL/min (by C-G formula based on SCr of 2.04 mg/dL (H)).    No Known Allergies  Antimicrobials this admission: 10/26 ceftriaxone  >>    Dose adjustments this admission:   Microbiology results:  10/26 UCx: pending   10/26  MRSA PCR: negative   Pharmacy will continue to monitor and adjust per consult.   Safa Derner L 10/15/2016 9:22 AM

## 2016-10-15 NOTE — Progress Notes (Addendum)
[facility] [place] NAME: Mackenzye Ostiguy    MR#:  [id number]  DATE OF BIRTH:  Feb 24, 1961  SUBJECTIVE:  CHIEF COMPLAINT:   Chief Complaint  Patient presents with  . Urinary Frequency  . Dizziness   No complaint, wants to go home. Still on insulin drip. REVIEW OF SYSTEMS:  Review of Systems  Constitutional: Positive for weight loss. Negative for chills and fever.  Eyes: Negative for blurred vision and double vision.  Respiratory: Negative for cough, shortness of breath and stridor.   Cardiovascular: Negative for chest pain and leg swelling.  Gastrointestinal: Negative for abdominal pain, diarrhea, nausea and vomiting.  Genitourinary: Positive for dysuria and frequency. Negative for hematuria and urgency.  Musculoskeletal: Negative for joint pain.  Skin: Negative for itching and rash.  Neurological: Negative for dizziness, focal weakness, loss of consciousness and headaches.  Psychiatric/Behavioral: Negative for depression. The patient is not nervous/anxious.     DRUG ALLERGIES:  No Known Allergies VITALS:  Blood pressure 111/72, pulse (!) 103, temperature 98.1 F (36.7 C), temperature source Oral, resp. rate 12, height 5\' 6"  (1.676 m), weight (!) 300 lb 7.8 oz (136.3 kg), SpO2 96 %. PHYSICAL EXAMINATION:  Physical Exam  Constitutional: She is oriented to person, place, and time and well-developed, well-nourished, and in no distress. No distress.  Morbid obesity  HENT:  Head: Normocephalic.  Mouth/Throat: Oropharynx is clear and moist.  Eyes: Conjunctivae and EOM are normal. No scleral icterus.  Neck: Normal range of motion. Neck supple. No JVD present. No tracheal deviation present.  Cardiovascular: Normal rate, regular rhythm and normal heart sounds.  Exam reveals no gallop.   No murmur heard. Pulmonary/Chest: Effort normal and breath sounds normal. No respiratory distress. She has no wheezes.  Abdominal: Soft. Bowel  sounds are normal. She exhibits no distension. There is no tenderness.  Musculoskeletal: Normal range of motion. She exhibits no edema or tenderness.  Neurological: She is alert and oriented to person, place, and time. No cranial nerve deficit.  Skin: No rash noted. No erythema.  Psychiatric:  The patient is upset and cried, she wants to go home by noon.   LABORATORY PANEL:   CBC  Recent Labs Lab 10/15/16 0459  WBC 7.3  HGB 13.9  HCT 41.7  PLT 193   ------------------------------------------------------------------------------------------------------------------ Chemistries   Recent Labs Lab 10/14/16 1246  10/15/16 0459  NA 121*  < > 135  K 4.8  < > 4.0  CL 85*  < > 100*  CO2 24  < > 28  GLUCOSE 878*  < > 131*  BUN 29*  < > 27*  CREATININE 2.61*  < > 2.04*  CALCIUM 9.0  < > 8.6*  AST 20  --   --   ALT 17  --   --   ALKPHOS 174*  --   --   BILITOT 0.4  --   --   < > = values in this interval not displayed. RADIOLOGY:  No results found. ASSESSMENT AND PLAN:   Fatou Geffrard  is a 55 y.o. female with a known history of asthma, hypertension comes to hospital with 2 week history of urinary frequency and dysuria.   #1 New onset diabetes with non ketotic hyperglycemia- blood sugars >800, started on IV insulin drip, BS is high.  A1c 12.5, carbohydrate controlled diet Start lantus 30 units daily with novolog 10 units Q4h. Try to wean off insulin drip.  #2 ARF- ATN,  Continue  IV fluids, f/u renal US and BMP. Hold naproxen, HCTZ and lisinopril.  #3 Hyponatremia- pseudohyponatremia- from elevated sugars Improved.  #4 Acute cystitis- urine and blood cultures -IV rocephin  #5 Hypertension- Hold HCTZ and lisinopril due to low BP and acute renal failure. Continue IV fluids  Hyperglycemia. Start Lipitor. Morbid obesity  All the records are reviewed and case discussed with Care Management/Social Worker. Management plans discussed with the patient, her husband  and they are in agreement.  CODE STATUS: full code  TOTAL CRITICAL TIME TAKING CARE OF THIS PATIENT: 52 minutes.   More than 50% of the time was spent in counseling/coordination of care: YES  POSSIBLE D/C IN 2 DAYS, DEPENDING ON CLINICAL CONDITION.   Demetrios Loll M.D on 10/15/2016 at 2:08 PM  Between 7am to 6pm - Pager - [phone]  After 6pm go to [email or web address] - Proofreader  [facility]  Office  [phone]  CC: Primary care physician; Casilda Carls, MD  Note: This dictation was prepared with Dragon dictation along with smaller phrase technology. Any transcriptional errors that result from this process are unintentional.

## 2016-10-15 NOTE — Progress Notes (Signed)
Inpatient Diabetes Program Recommendations  AACE/ADA: New Consensus Statement on Inpatient Glycemic Control (2015)  Target Ranges:  Prepandial:   less than 140 mg/dL      Peak postprandial:   less than 180 mg/dL (1-2 hours)      Critically ill patients:  140 - 180 mg/dL   Lab Results  Component Value Date   GLUCAP 211 (H) 10/15/2016   HGBA1C 12.5 (H) 10/14/2016    Review of Glycemic ControlResults for Braman, Cozy W (MRN [id number]) as of 10/15/2016 09:39  Ref. Range 10/14/2016 15:32 10/14/2016 17:32  Glucose-Capillary Latest Ref Range: 65 - 99 mg/dL >600 (HH) 569 (HH)   Diabetes history: None Outpatient Diabetes medications: None Current orders for Inpatient glycemic control:  Patient transitioning off insulin drip to Lantus 20 units and sensitive correction. Inpatient Diabetes Program Recommendations:    Called and discussed transition with RN.  Patient is new to insulin so insulin starter kit and Living well with diabetes kit ordered. RN to start insulin teaching.  Care management consult placed.  Text paged MD regarding potentially adding meal coverage.  If patient is unable to get prescription assistance, may need 70/30 at d/c which is 24.99$ at [facility].  Also patient will need to get generic meter at [facility] or other pharmacy due to lower cost of strips/meter.    Will talk with patient later today regarding new diagnosis and survival skills.  Thanks,  Jenny , RN, BC-ADM Inpatient Diabetes Coordinator Pager [phone] (8a-5p)   

## 2016-10-15 NOTE — Progress Notes (Addendum)
Inpatient Diabetes Program Recommendations  AACE/ADA: New Consensus Statement on Inpatient Glycemic Control (2015)  Target Ranges:  Prepandial:   less than 140 mg/dL      Peak postprandial:   less than 180 mg/dL (1-2 hours)      Critically ill patients:  140 - 180 mg/dL   Lab Results  Component Value Date   GLUCAP 349 (H) 10/15/2016   HGBA1C 12.5 (H) 10/14/2016    Inpatient Diabetes Program Recommendations:    Call received from RN regarding patients blood sugar increasing on insulin drip despite CHO coverage and Lantus.  May consider increasing Lantus to 30 units daily with one Lantus dose of 10 units now.  May also consider continuing insulin drip until blood sugars less than 200 mg/dL.  Also consider increasing Meal coverage to 7 units tid with meals.  Since blood sugars remain variable, consider Novolog q 4 hours once patient is transitioned off insulin drip.  Text paged MD.  Thanks,  Adah Perl, RN, BC-ADM Inpatient Diabetes Coordinator Pager [phone] (8a-5p)

## 2016-10-15 NOTE — Progress Notes (Signed)
Pt will not be transferring, central tele called and patient having pauses.

## 2016-10-15 NOTE — Plan of Care (Signed)
Problem: Food- and Nutrition-Related Knowledge Deficit (NB-1.1) Goal: Nutrition education Formal process to instruct or train a patient/client in a skill or to impart knowledge to help patients/clients voluntarily manage or modify food choices and eating behavior to maintain or improve health. Outcome: Adequate for Discharge  RD consulted for nutrition education regarding diabetes. 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  Lab Results  Component Value Date   HGBA1C 12.5 (H) 10/14/2016    RD provided "Carbohydrate Counting for People with Diabetes" handout from the Academy of Nutrition and Dietetics. Discussed different food groups and their effects on blood sugar, emphasizing carbohydrate-containing foods. Provided list of carbohydrates and recommended serving sizes of common foods. Reviewed label reading.   Discussed importance of controlled and consistent carbohydrate intake throughout the day. Reinforced importance of not skipping meals, suggested pt not go more than 4-5 hours without eating. Provided examples of ways to balance meals/snacks and encouraged intake of high-fiber, whole grain complex carbohydrates. Encouraged sugar free beverage intake. Teach back method used.  Expect fair compliance. Recommend further education as outpatient as pt with newly dx DM if able. RD will be happy to return for further education while inpatient if requested.  Body mass index is 48.5 kg/m. Pt meets criteria for morbid obesity based on current BMI.  Current diet order is Carb Modified, patient is eating some but not wanting to eat/scared to eat as pt does not want this to be the cause for not discharging. Again reinforced the importance of good nutrition and eating balanced meals as best way to manage blood sugars.  Labs and medications reviewed. No further nutrition interventions warranted at this  time. RD contact information provided. If additional nutrition issues arise, please re-consult RD.  Kerman Passey MS, RD, LDN [phone] Pager  [phone] Weekend/After Hours On-Call Pager

## 2016-10-15 NOTE — Progress Notes (Signed)
Called MD to inquire about pt being transferred to another unit due to not being on insulin gtt. FBS at 2000 was 410 but pt had a personal pizza at dinner time and then a quesadilla that the family brought in. Informed patient and family members about diet but noncompliant. Pt voicing that she is leaving tomorrow regardless of medical advice.

## 2016-10-15 NOTE — Progress Notes (Addendum)
Spoke with diabetes coordinator, and Dr. Bridgett Larsson increased lantus to 30 units daily. 10 units given now to go along with 20 units given this am. Patient demontrated return demonstration with insulin administration. Patient to remain on insulin gtt per diabetes coordinator's recommendation until CBGs are less than 200. Last CBG 392. Insulin gtt maintained at 13.9 per stabilizer. Aimee Buck   Patient off unit for kidney US, per Dr. Bridgett Larsson okay for patient to travel without nursing staff.  Care order placed.

## 2016-10-15 NOTE — Progress Notes (Signed)
Spoke with diabetes coordinator about starting patient on d5 1/2 NS since it was started, but not continued during the night d/t to CBG increase. Diabetes coordinator stated that this needed to be started and expressed concern that patient may have drank or ate something without previous nurse knowing. D5 1/2 NS started at this time and insulin gtt adjusted per glucose stabilizer. Patient updated on plan of care at this time, waiting for MD to round. Wilnette Kales

## 2016-10-15 NOTE — Care Management (Signed)
Patient admitted to icu with new onset DM / DKA.  She is without insurance but is employed.  Her husband has diabetes and she has watched him give himself injections.  He has Lantus insulin and states that she can use that if it is ordered.  Discussed the need to use own prescriptions.  Provided patient with Reli On Glucose monitor and supplies.  She has self injected insulin x 2.  Provided her with [facility].  She currently remains on insulin drip. Discussed use of generic meds where able and to speak with discharging physician about an affordable insulin

## 2016-10-15 NOTE — Progress Notes (Signed)
Patient's CBGs continued to be elevated when she started eating despite covering carbs, spoke with diabetes coordinators since patient was supposed to come off gtt at 1230 (2 hours after lantus administration). Per Diabetes coordinator leave patient another hour on gtt until they are able to change their recommendations to Dr. Bridgett Larsson, patient may need more insulin when eating. Patient remains on insulin gtt at 13.9 units/hr per stabilizer with CBG of 349.  Wilnette Kales

## 2016-10-15 NOTE — Progress Notes (Signed)
Patient transitioned off insulin gtt, novolog administered before dinner. Aimee Buck

## 2016-10-15 NOTE — Progress Notes (Signed)
Inpatient Diabetes Program Recommendations  AACE/ADA: New Consensus Statement on Inpatient Glycemic Control (2015)  Target Ranges:  Prepandial:   less than 140 mg/dL      Peak postprandial:   less than 180 mg/dL (1-2 hours)      Critically ill patients:  140 - 180 mg/dL   Inpatient Diabetes Program:    Spoke with patient regarding diabetes survival skills and new diagnosis.  Patient states that she is familiar with insulin administration, monitoring and diabetes due to her husband having diabetes and working in a group home.  Discussed that meter can be purchased from [facility] for 10$.  Case management consult in place as patient does not have PCP or insurance.  Will need medication assistance.  She states that her husband gets [facility] from [place].  Reviewed normal blood sugars, hyperglycemia, hypoglycemia, and the importance of follow-up.  RN to give patient insulin starter kit and Living well with diabetes booklet.  If unable to get assistance for medication, may do better with Novolin 70/30?  Patient states she has no further questions at this time.  RN to reinforce survival skill education.  Thanks, Adah Perl, RN, BC-ADM Inpatient Diabetes Coordinator Pager [phone] (8a-5p)

## 2016-10-16 LAB — URINE CULTURE

## 2016-10-16 LAB — BASIC METABOLIC PANEL
Anion gap: 5 (ref 5–15)
BUN: 28 mg/dL — AB (ref 6–20)
CALCIUM: 9 mg/dL (ref 8.9–10.3)
CO2: 26 mmol/L (ref 22–32)
CREATININE: 1.71 mg/dL — AB (ref 0.44–1.00)
Chloride: 102 mmol/L (ref 101–111)
GFR calc Af Amer: 39 mL/min — ABNORMAL LOW (ref >60)
GFR, EST NON AFRICAN AMERICAN: 33 mL/min — AB (ref >60)
Glucose, Bld: 333 mg/dL — ABNORMAL HIGH (ref 65–99)
POTASSIUM: 4.4 mmol/L (ref 3.5–5.1)
SODIUM: 133 mmol/L — AB (ref 135–145)

## 2016-10-16 LAB — GLUCOSE, CAPILLARY
GLUCOSE-CAPILLARY: 313 mg/dL — AB (ref 65–99)
GLUCOSE-CAPILLARY: 285 mg/dL — AB (ref 65–99)
Glucose-Capillary: 279 mg/dL — ABNORMAL HIGH (ref 65–99)
Glucose-Capillary: 356 mg/dL — ABNORMAL HIGH (ref 65–99)

## 2016-10-16 MED ORDER — INSULIN GLARGINE 100 UNIT/ML ~~LOC~~ SOLN: 30.0000 [IU] | Freq: Two times a day (BID) | SUBCUTANEOUS | 3 refills | Status: DC

## 2016-10-16 MED ORDER — ATORVASTATIN CALCIUM 40 MG PO TABS: 40.0000 mg | ORAL_TABLET | Freq: Every day | ORAL | 2 refills | Status: DC

## 2016-10-16 MED ORDER — SODIUM CHLORIDE 0.9 % IV SOLN
INTRAVENOUS | Status: DC
  Administered 2016-10-16: 09:00:00 via INTRAVENOUS

## 2016-10-16 MED ORDER — INSULIN ASPART 100 UNIT/ML ~~LOC~~ SOLN
15.0000 [IU] | Freq: Three times a day (TID) | SUBCUTANEOUS | Status: DC
  Administered 2016-10-16 (×2): 15 [IU] via SUBCUTANEOUS
  Filled 2016-10-16 (×2): qty 15

## 2016-10-16 MED ORDER — INSULIN GLARGINE 100 UNIT/ML ~~LOC~~ SOLN
40.0000 [IU] | Freq: Every day | SUBCUTANEOUS | Status: DC
  Administered 2016-10-16: 40 [IU] via SUBCUTANEOUS
  Filled 2016-10-16: qty 0.4

## 2016-10-16 MED ORDER — CEPHALEXIN 500 MG PO CAPS: 500.0000 mg | ORAL_CAPSULE | Freq: Four times a day (QID) | ORAL | 0 refills | Status: AC

## 2016-10-16 MED ORDER — GABAPENTIN 100 MG PO CAPS: 200.0000 mg | ORAL_CAPSULE | Freq: Two times a day (BID) | ORAL | 0 refills | Status: DC

## 2016-10-16 MED ORDER — INSULIN ASPART 100 UNIT/ML ~~LOC~~ SOLN: 15.0000 [IU] | Freq: Three times a day (TID) | SUBCUTANEOUS | 3 refills | Status: DC

## 2016-10-16 MED ORDER — INSULIN GLARGINE 100 UNIT/ML ~~LOC~~ SOLN
10.0000 [IU] | Freq: Once | SUBCUTANEOUS | Status: AC
  Administered 2016-10-16: 10 [IU] via SUBCUTANEOUS
  Filled 2016-10-16: qty 0.1

## 2016-10-16 NOTE — Care Management Note (Signed)
Case Management Note  Patient Details  Name: SHYLIE GOETZE MRN: [id number] Date of Birth: 30-Jul-1961  Subjective/Objective:     This Probation officer went to ICU 05 to provide uninsured Ms Pirillo with a coupon ([place]) which would allow Ms Arnette Schaumann to obtain each of her prescriptions for $3.00 each. Ms Lappin had already left [facility] when this writer arrived in her ICU room today. Her prescriptions had been electronically sent to a pharmacy with does not participate in the [place]. This writer is attempting to contact Ms Prorok by phone.               Action/Plan:   Expected Discharge Date:  10/16/16               Expected Discharge Plan:     In-House Referral:     Discharge planning Services     Post Acute Care Choice:    Choice offered to:     DME Arranged:    DME Agency:     HH Arranged:    HH Agency:     Status of Service:     If discussed at [employer], dates discussed:    Additional Comments:  Altamese Deguire A, RN 10/16/2016, 3:04 PM

## 2016-10-16 NOTE — Plan of Care (Signed)
Problem: Nutrition: Goal: Adequate nutrition will be maintained Outcome: Progressing BP (!) 102/53   Pulse (!) 103   Temp 98.4 F (36.9 C) (Oral)   Resp 16   Ht 5\' 6"  (1.676 m)   Wt (!) 136.3 kg (300 lb 7.8 oz)   SpO2 96%   BMI 48.50 kg/m   Cont on blood sugar  Monitoring  Possible dc in the afternoon

## 2016-10-16 NOTE — Progress Notes (Signed)
No further pauses reported this shift. Pt remains in ST in the low 100s. Had to have 2L O2 since pt sats drops to 70s-80s when asleep. FBS slowly trickling down. Was 313 at 0400 and received 11 units of sliding scale insulin.

## 2016-10-16 NOTE — Discharge Summary (Signed)
[facility] [place] NAME: Aimee Buck    MR#:  [id number]  DATE OF BIRTH:  1961/08/14  DATE OF ADMISSION:  10/14/2016   ADMITTING PHYSICIAN: Gladstone Lighter, MD  DATE OF DISCHARGE: 10/16/2016  PRIMARY CARE PHYSICIAN: Casilda Carls, MD   ADMISSION DIAGNOSIS:  ARF (acute renal failure) (HCC) [N17.9] Hyperglycemia [R73.9] Acute cystitis with hematuria [N30.01] AKI (acute kidney injury) ([facility]) [N17.9] DISCHARGE DIAGNOSIS:  Active Problems:   Hyperglycemia  SECONDARY DIAGNOSIS:   Past Medical History:  Diagnosis Date  . Asthma   . Hypertension    HOSPITAL COURSE:   Aimee Buck a 55 y.o. femalewith a known history of asthma, hypertension comes to hospital with 2 week history of urinary frequency and dysuria.   #1 New onset diabetes with non ketotic hyperglycemia- blood sugars >800,   A1c 12.5, carbohydrate controlled diet started on IV insulin drip, then changed to lantus 30 units daily with novolog 10 units Q4h. BS is still not controlled after increasing lantus to 50 units with novolog 15 units AC. But the patient desperately wants to go home today. I will change Lantus to 30 units twice a day with NovoLog 15 units before meals meals. She needed follow-up her primary care physician this coming week as soon as possible.  #2 ARF- ATN, Cr. Down to 1.7. Improving with IV fluids, unremarkable renal US. Follow-up BMP as outpatient. Hold naproxen, HCTZ and lisinopril.  #3 Hyponatremia- pseudohyponatremia- from elevated sugars Improved.  #4 Acute cystitis- urine culture report suggests recollection. She has been treated with IV rocephin, changed to by mouth Keflex for 5 more days.  #5 Hypertension- Hold HCTZ and lisinopril due to low BP and acute renal failure.  Hyperglycemia. Started Lipitor. Morbid obesity  DISCHARGE CONDITIONS:  Stable, discharge to home today CONSULTS OBTAINED:   DRUG ALLERGIES:    No Known Allergies DISCHARGE MEDICATIONS:     Medication List    STOP taking these medications   cyclobenzaprine 10 MG tablet Commonly known as:  FLEXERIL   hydrochlorothiazide 25 MG tablet Commonly known as:  HYDRODIURIL   lisinopril 20 MG tablet Commonly known as:  PRINIVIL,ZESTRIL   naproxen 375 MG tablet Commonly known as:  NAPROSYN     TAKE these medications   atorvastatin 40 MG tablet Commonly known as:  LIPITOR Take 1 tablet (40 mg total) by mouth daily at 6 PM.   cephALEXin 500 MG capsule Commonly known as:  KEFLEX Take 1 capsule (500 mg total) by mouth 4 (four) times daily.   gabapentin 100 MG capsule Commonly known as:  NEURONTIN Take 2 capsules (200 mg total) by mouth 2 (two) times daily.   insulin aspart 100 UNIT/ML injection Commonly known as:  novoLOG Inject 15 Units into the skin 3 (three) times daily with meals.   insulin glargine 100 UNIT/ML injection Commonly known as:  LANTUS Inject 0.3 mLs (30 Units total) into the skin 2 (two) times daily.        DISCHARGE INSTRUCTIONS:  See AVS.  If you experience worsening of your admission symptoms, develop shortness of breath, life threatening emergency, suicidal or homicidal thoughts you must seek medical attention immediately by calling 911 or calling your MD immediately  if symptoms less severe.  You Must read complete instructions/literature along with all the possible adverse reactions/side effects for all the Medicines you take and that have been prescribed to you. Take any new Medicines after you have completely understood and accpet all the  possible adverse reactions/side effects.   Please note  You were cared for by a hospitalist during your hospital stay. If you have any questions about your discharge medications or the care you received while you were in the hospital after you are discharged, you can call the unit and asked to speak with the hospitalist on call if the hospitalist that took  care of you is not available. Once you are discharged, your primary care physician will handle any further medical issues. Please note that NO REFILLS for any discharge medications will be authorized once you are discharged, as it is imperative that you return to your primary care physician (or establish a relationship with a primary care physician if you do not have one) for your aftercare needs so that they can reassess your need for medications and monitor your lab values.    On the day of Discharge:  VITAL SIGNS:  Blood pressure (!) 145/93, pulse 94, temperature 98.4 F (36.9 C), temperature source Oral, resp. rate 17, height 5\' 6"  (1.676 m), weight (!) 300 lb 7.8 oz (136.3 kg), SpO2 95 %. PHYSICAL EXAMINATION:  GENERAL:  55 y.o.-year-old patient lying in the bed with no acute distress. Morbidly obese. EYES: Pupils equal, round, reactive to light and accommodation. No scleral icterus. Extraocular muscles intact.  HEENT: Head atraumatic, normocephalic. Oropharynx and nasopharynx clear.  NECK:  Supple, no jugular venous distention. No thyroid enlargement, no tenderness.  LUNGS: Normal breath sounds bilaterally, no wheezing, rales,rhonchi or crepitation. No use of accessory muscles of respiration.  CARDIOVASCULAR: S1, S2 normal. No murmurs, rubs, or gallops.  ABDOMEN: Soft, non-tender, non-distended. Bowel sounds present. No organomegaly or mass.  EXTREMITIES: No pedal edema, cyanosis, or clubbing.  NEUROLOGIC: Cranial nerves II through XII are intact. Muscle strength 5/5 in all extremities. Sensation intact. Gait not checked.  PSYCHIATRIC: The patient is alert and oriented x 3.  SKIN: No obvious rash, lesion, or ulcer.  DATA REVIEW:   CBC  Recent Labs Lab 10/15/16 0459  WBC 7.3  HGB 13.9  HCT 41.7  PLT 193    Chemistries   Recent Labs Lab 10/14/16 1246  10/16/16 0433  NA 121*  < > 133*  K 4.8  < > 4.4  CL 85*  < > 102  CO2 24  < > 26  GLUCOSE 878*  < > 333*  BUN 29*  < >  28*  CREATININE 2.61*  < > 1.71*  CALCIUM 9.0  < > 9.0  AST 20  --   --   ALT 17  --   --   ALKPHOS 174*  --   --   BILITOT 0.4  --   --   < > = values in this interval not displayed.   Microbiology Results  Results for orders placed or performed during the hospital encounter of 10/14/16  MRSA PCR Screening     Status: None   Collection Time: 10/14/16  7:09 PM  Result Value Ref Range Status   MRSA by PCR NEGATIVE NEGATIVE Final    Comment:        The GeneXpert MRSA Assay (FDA approved for NASAL specimens only), is one component of a comprehensive MRSA colonization surveillance program. It is not intended to diagnose MRSA infection nor to guide or monitor treatment for MRSA infections.   Urine culture     Status: Abnormal   Collection Time: 10/15/16 12:17 PM  Result Value Ref Range Status   Specimen Description URINE, CLEAN CATCH  Final   Special Requests NONE  Final   Culture MULTIPLE SPECIES PRESENT, SUGGEST RECOLLECTION (A)  Final   Report Status 10/16/2016 FINAL  Final    RADIOLOGY:  No results found.   Management plans discussed with the patient, Her husband and they are in agreement.  CODE STATUS:     Code Status Orders        Start     Ordered   10/14/16 1910  Full code  Continuous     10/14/16 1909    Code Status History    Date Active Date Inactive Code Status Order ID Comments User Context   This patient has a current code status but no historical code status.      TOTAL TIME TAKING CARE OF THIS PATIENT: 46 minutes.    Demetrios Loll M.D on 10/16/2016 at 2:44 PM  Between 7am to 6pm - Pager - [phone]  After 6pm go to [email or web address] - Proofreader  [facility]  Office  [phone]  CC: Primary care physician; Casilda Carls, MD   Note: This dictation was prepared with Dragon dictation along with smaller phrase technology. Any transcriptional errors that result from this process are unintentional.

## 2016-10-16 NOTE — Plan of Care (Signed)
Discharge instructions reviewed with pt, pt verbalized understanding.

## 2016-10-16 NOTE — Discharge Instructions (Signed)
Heart healthy and ADA diet. Diet control and exercise. Smoking cessation.

## 2016-12-08 ENCOUNTER — Encounter: Payer: Self-pay | Admitting: *Deleted

## 2016-12-08 ENCOUNTER — Ambulatory Visit
Admission: RE | Admit: 2016-12-08 | Discharge: 2016-12-08 | Disposition: A | Discharge status: Home / Self Care | Payer: Self-pay | Source: Ambulatory Visit | Attending: Oncology | Admitting: Oncology

## 2016-12-08 ENCOUNTER — Ambulatory Visit: Payer: Self-pay | Attending: Oncology | Admitting: *Deleted

## 2016-12-08 ENCOUNTER — Encounter (INDEPENDENT_AMBULATORY_CARE_PROVIDER_SITE_OTHER): Payer: Self-pay

## 2016-12-08 VITALS — BP 107/57 | HR 78 | Temp 97.7°F | Resp 20 | Ht 67.32 in | Wt 310.4 lb

## 2016-12-08 DIAGNOSIS — Z0000 Encounter for general adult medical examination without abnormal findings: Secondary | ICD-10-CM

## 2016-12-08 NOTE — Progress Notes (Signed)
Subjective:     Patient ID: Aimee Buck, female   DOB: August 19, 1961, 55 y.o.   MRN: [id number]  HPI   Review of Systems     Objective:   Physical Exam  Pulmonary/Chest: Right breast exhibits no inverted nipple, no mass, no nipple discharge, no skin change and no tenderness. Left breast exhibits no inverted nipple, no mass, no nipple discharge, no skin change and no tenderness. Breasts are symmetrical.  Large pendulous breast  Abdominal: There is no splenomegaly or hepatomegaly.  Genitourinary: No labial fusion. There is no rash, tenderness, lesion or injury on the right labia. There is no rash, tenderness, lesion or injury on the left labia. Cervix exhibits no motion tenderness, no discharge and no friability. Right adnexum displays no mass, no tenderness and no fullness. Left adnexum displays no mass, no tenderness and no fullness. No erythema, tenderness or bleeding in the vagina. No foreign body in the vagina. No signs of injury around the vagina. No vaginal discharge found.         Assessment:     55 year old Black female presents to [facility] for clinical breast exam, pap and mammogram.  Patient has never had a mammogram, and last pap was 30 years ago.  Clinical breast exam unremarkable.  Taught self breast awareness.  Specimen collected for pap smear.  Patient's upper torso is covered in patchy spotty skin discoloration.  Denies any puritus.  States her primary care is aware.   Patient has been screened for eligibility.  She does not have any insurance, Medicare or Medicaid.  She also meets financial eligibility.  Hand-out given on the Affordable Care Act.    Plan:     Screening mammogram ordered.  Pap specimen sent to the lab.  Will follow-up per [facility] protocol.

## 2016-12-08 NOTE — Patient Instructions (Signed)
HPV Test The human papillomavirus (HPV) test is used to look for high-risk types of HPV infection. HPV is a group of about 100 viruses. Many of these viruses cause growths on, in, or around the genitals. Most HPV viruses cause infections that usually go away without treatment. However, HPV types 6, 11, 16, and 18 are considered high-risk types of HPV that can increase your risk of cancer of the cervix or anus if the infection is left untreated. An HPV test identifies the DNA (genetic) strands of the HPV infection, so it is also referred to as the HPV DNA test. Although HPV is found in both males and females, the HPV test is only used to screen for increased cancer risk in females:  With an abnormal Pap test.  After treatment of an abnormal Pap test.  Between the ages of 30 and 65.  After treatment of a high-risk HPV infection. The HPV test may be done at the same time as a pelvic exam and Pap test in females over the age of 30. Both the HPV test and Pap test require a sample of cells from the cervix. How do I prepare for this test?  Do not douche or take a bath for 24-48 hours before the test or as directed by your health care provider.  Do not have sex for 24-48 hours before the test or as directed by your health care provider.  You may be asked to reschedule the test if you are menstruating.  You will be asked to urinate before the test. What do the results mean? It is your responsibility to obtain your test results. Ask the lab or department performing the test when and how you will get your results. Talk with your health care provider if you have any questions about your results. Your result will be negative or positive. Meaning of Negative Test Results  A negative HPV test result means that no HPV was found, and it is very likely that you do not have HPV. Meaning of Positive Test Results  A positive HPV test result indicates that you have HPV.  If your test result shows the presence  of any high-risk HPV strains, you may have an increased risk of developing cancer of the cervix or anus if the infection is left untreated.  If any low-risk HPV strains are found, you are not likely to have an increased risk of cancer. Discuss your test results with your health care provider. He or she will use the results to make a diagnosis and determine a treatment plan that is right for you. Talk with your health care provider to discuss your results, treatment options, and if necessary, the need for more tests. Talk with your health care provider if you have any questions about your results. This information is not intended to replace advice given to you by your health care provider. Make sure you discuss any questions you have with your health care provider. Document Released: 12/31/2004 Document Revised: 08/11/2016 Document Reviewed: 04/23/2014 Elsevier Interactive Patient Education  2017 Elsevier Inc.  Gave patient hand-out, Women Staying Healthy, Active and Well from [facility], with education on breast health, pap smears, heart and colon health.  

## 2016-12-15 LAB — PAP LB AND HPV HIGH-RISK
HPV, HIGH-RISK: NEGATIVE
PAP Smear Comment: 0

## 2016-12-16 ENCOUNTER — Encounter: Payer: Self-pay | Admitting: *Deleted

## 2016-12-16 NOTE — Progress Notes (Signed)
Letter mailed to inform patient of her normal mammogram and pap smear.  Next mammo due in one year and pap in 5 years.  HSIS to Christy. 

## 2017-03-09 ENCOUNTER — Inpatient Hospital Stay
Admission: EM | Admit: 2017-03-09 | Discharge: 2017-03-11 | DRG: 690 | Disposition: A | Discharge status: Home / Self Care | Payer: Self-pay | Attending: Internal Medicine | Admitting: Internal Medicine

## 2017-03-09 ENCOUNTER — Emergency Department: Payer: Self-pay

## 2017-03-09 DIAGNOSIS — Z79899 Other long term (current) drug therapy: Secondary | ICD-10-CM

## 2017-03-09 DIAGNOSIS — E871 Hypo-osmolality and hyponatremia: Secondary | ICD-10-CM | POA: Diagnosis present

## 2017-03-09 DIAGNOSIS — E1122 Type 2 diabetes mellitus with diabetic chronic kidney disease: Secondary | ICD-10-CM | POA: Diagnosis present

## 2017-03-09 DIAGNOSIS — N179 Acute kidney failure, unspecified: Secondary | ICD-10-CM | POA: Diagnosis present

## 2017-03-09 DIAGNOSIS — J449 Chronic obstructive pulmonary disease, unspecified: Secondary | ICD-10-CM | POA: Diagnosis present

## 2017-03-09 DIAGNOSIS — N183 Chronic kidney disease, stage 3 (moderate): Secondary | ICD-10-CM | POA: Diagnosis present

## 2017-03-09 DIAGNOSIS — F17210 Nicotine dependence, cigarettes, uncomplicated: Secondary | ICD-10-CM | POA: Diagnosis present

## 2017-03-09 DIAGNOSIS — I129 Hypertensive chronic kidney disease with stage 1 through stage 4 chronic kidney disease, or unspecified chronic kidney disease: Secondary | ICD-10-CM | POA: Diagnosis present

## 2017-03-09 DIAGNOSIS — N12 Tubulo-interstitial nephritis, not specified as acute or chronic: Secondary | ICD-10-CM | POA: Diagnosis present

## 2017-03-09 DIAGNOSIS — Z794 Long term (current) use of insulin: Secondary | ICD-10-CM

## 2017-03-09 DIAGNOSIS — Z91041 Radiographic dye allergy status: Secondary | ICD-10-CM

## 2017-03-09 DIAGNOSIS — R1012 Left upper quadrant pain: Secondary | ICD-10-CM

## 2017-03-09 DIAGNOSIS — Z6841 Body Mass Index (BMI) 40.0 and over, adult: Secondary | ICD-10-CM

## 2017-03-09 DIAGNOSIS — N10 Acute tubulo-interstitial nephritis: Principal | ICD-10-CM | POA: Diagnosis present

## 2017-03-09 DIAGNOSIS — E860 Dehydration: Secondary | ICD-10-CM | POA: Diagnosis present

## 2017-03-09 DIAGNOSIS — Z8249 Family history of ischemic heart disease and other diseases of the circulatory system: Secondary | ICD-10-CM

## 2017-03-09 HISTORY — DX: Type 2 diabetes mellitus without complications: E11.9

## 2017-03-09 HISTORY — DX: Chronic obstructive pulmonary disease, unspecified: J44.9

## 2017-03-09 LAB — COMPREHENSIVE METABOLIC PANEL
ALBUMIN: 3.3 g/dL — AB (ref 3.5–5.0)
ALT: 15 U/L (ref 14–54)
AST: 25 U/L (ref 15–41)
Alkaline Phosphatase: 69 U/L (ref 38–126)
Anion gap: 12 (ref 5–15)
BILIRUBIN TOTAL: 0.9 mg/dL (ref 0.3–1.2)
BUN: 66 mg/dL — AB (ref 6–20)
CHLORIDE: 93 mmol/L — AB (ref 101–111)
CO2: 24 mmol/L (ref 22–32)
CREATININE: 3.22 mg/dL — AB (ref 0.44–1.00)
Calcium: 9 mg/dL (ref 8.9–10.3)
GFR calc Af Amer: 18 mL/min — ABNORMAL LOW (ref >60)
GFR, EST NON AFRICAN AMERICAN: 15 mL/min — AB (ref >60)
GLUCOSE: 134 mg/dL — AB (ref 65–99)
Potassium: 3.9 mmol/L (ref 3.5–5.1)
Sodium: 129 mmol/L — ABNORMAL LOW (ref 135–145)
TOTAL PROTEIN: 8.2 g/dL — AB (ref 6.5–8.1)

## 2017-03-09 LAB — CBC WITH DIFFERENTIAL/PLATELET
BASOS ABS: 0 10*3/uL (ref 0–0.1)
BASOS PCT: 0 %
Eosinophils Absolute: 0 10*3/uL (ref 0–0.7)
Eosinophils Relative: 0 %
HCT: 42.8 % (ref 35.0–47.0)
HEMOGLOBIN: 14.7 g/dL (ref 12.0–16.0)
LYMPHS PCT: 9 %
Lymphs Abs: 0.9 10*3/uL — ABNORMAL LOW (ref 1.0–3.6)
MCH: 34.6 pg — ABNORMAL HIGH (ref 26.0–34.0)
MCHC: 34.3 g/dL (ref 32.0–36.0)
MCV: 100.8 fL — AB (ref 80.0–100.0)
Monocytes Absolute: 2 10*3/uL — ABNORMAL HIGH (ref 0.2–0.9)
Monocytes Relative: 19 %
NEUTROS ABS: 7.5 10*3/uL — AB (ref 1.4–6.5)
NEUTROS PCT: 72 %
Platelets: 209 10*3/uL (ref 150–440)
RBC: 4.24 MIL/uL (ref 3.80–5.20)
RDW: 15.6 % — ABNORMAL HIGH (ref 11.5–14.5)
WBC: 10.5 10*3/uL (ref 3.6–11.0)

## 2017-03-09 LAB — GLUCOSE, CAPILLARY
GLUCOSE-CAPILLARY: 115 mg/dL — AB (ref 65–99)
Glucose-Capillary: 112 mg/dL — ABNORMAL HIGH (ref 65–99)
Glucose-Capillary: 130 mg/dL — ABNORMAL HIGH (ref 65–99)

## 2017-03-09 LAB — URINALYSIS, COMPLETE (UACMP) WITH MICROSCOPIC
Bilirubin Urine: NEGATIVE
GLUCOSE, UA: NEGATIVE mg/dL
KETONES UR: NEGATIVE mg/dL
Leukocytes, UA: NEGATIVE
Nitrite: NEGATIVE
PROTEIN: 30 mg/dL — AB
Specific Gravity, Urine: 1.016 (ref 1.005–1.030)
pH: 5 (ref 5.0–8.0)

## 2017-03-09 LAB — LACTIC ACID, PLASMA: Lactic Acid, Venous: 1 mmol/L (ref 0.5–1.9)

## 2017-03-09 LAB — LIPASE, BLOOD: Lipase: 28 U/L (ref 11–51)

## 2017-03-09 MED ORDER — SODIUM CHLORIDE 0.9 % IV SOLN
1000.0000 mL | Freq: Once | INTRAVENOUS | Status: AC
  Administered 2017-03-09: 1000 mL via INTRAVENOUS

## 2017-03-09 MED ORDER — ONDANSETRON HCL 4 MG/2ML IJ SOLN
4.0000 mg | Freq: Once | INTRAMUSCULAR | Status: AC
  Administered 2017-03-09: 4 mg via INTRAVENOUS
  Filled 2017-03-09: qty 2

## 2017-03-09 MED ORDER — INSULIN ASPART 100 UNIT/ML ~~LOC~~ SOLN: 0.0000 [IU] | Freq: Three times a day (TID) | SUBCUTANEOUS | Status: DC

## 2017-03-09 MED ORDER — HEPARIN SODIUM (PORCINE) 5000 UNIT/ML IJ SOLN
5000.0000 [IU] | Freq: Three times a day (TID) | INTRAMUSCULAR | Status: DC
  Administered 2017-03-09 – 2017-03-11 (×6): 5000 [IU] via SUBCUTANEOUS
  Filled 2017-03-09 (×6): qty 1

## 2017-03-09 MED ORDER — ALBUTEROL SULFATE (2.5 MG/3ML) 0.083% IN NEBU: 2.5000 mg | INHALATION_SOLUTION | RESPIRATORY_TRACT | Status: DC | PRN

## 2017-03-09 MED ORDER — CEFTRIAXONE SODIUM-DEXTROSE 1-3.74 GM-% IV SOLR: 1.0000 g | INTRAVENOUS | Status: DC

## 2017-03-09 MED ORDER — BARIUM SULFATE 2.1 % PO SUSP: 450.0000 mL | ORAL | Status: DC

## 2017-03-09 MED ORDER — POTASSIUM CHLORIDE IN NACL 20-0.9 MEQ/L-% IV SOLN
INTRAVENOUS | Status: DC
  Administered 2017-03-09 – 2017-03-10 (×2): via INTRAVENOUS
  Filled 2017-03-09 (×5): qty 1000

## 2017-03-09 MED ORDER — CEFTRIAXONE SODIUM-DEXTROSE 1-3.74 GM-% IV SOLR
1.0000 g | Freq: Once | INTRAVENOUS | Status: AC
  Administered 2017-03-09: 1 g via INTRAVENOUS
  Filled 2017-03-09: qty 50

## 2017-03-09 MED ORDER — ACETAMINOPHEN 650 MG RE SUPP: 650.0000 mg | Freq: Four times a day (QID) | RECTAL | Status: DC | PRN

## 2017-03-09 MED ORDER — ATORVASTATIN CALCIUM 20 MG PO TABS
40.0000 mg | ORAL_TABLET | Freq: Every day | ORAL | Status: DC
  Administered 2017-03-09: 18:00:00 40 mg via ORAL
  Filled 2017-03-09 (×2): qty 2

## 2017-03-09 MED ORDER — DEXTROSE 5 % IV SOLN: 1.0000 g | Freq: Once | INTRAVENOUS | Status: DC

## 2017-03-09 MED ORDER — MORPHINE SULFATE (PF) 4 MG/ML IV SOLN
4.0000 mg | Freq: Once | INTRAVENOUS | Status: AC
  Administered 2017-03-09: 4 mg via INTRAVENOUS
  Filled 2017-03-09: qty 1

## 2017-03-09 MED ORDER — MOMETASONE FURO-FORMOTEROL FUM 100-5 MCG/ACT IN AERO
2.0000 | INHALATION_SPRAY | Freq: Two times a day (BID) | RESPIRATORY_TRACT | Status: DC
  Administered 2017-03-09 – 2017-03-11 (×4): 2 via RESPIRATORY_TRACT
  Filled 2017-03-09: qty 8.8

## 2017-03-09 MED ORDER — HYDROCODONE-ACETAMINOPHEN 5-325 MG PO TABS
1.0000 | ORAL_TABLET | ORAL | Status: DC | PRN
  Administered 2017-03-09 (×2): 2 via ORAL
  Administered 2017-03-10: 1 via ORAL
  Filled 2017-03-09: qty 1
  Filled 2017-03-09 (×2): qty 2

## 2017-03-09 MED ORDER — INSULIN ASPART 100 UNIT/ML ~~LOC~~ SOLN
10.0000 [IU] | Freq: Three times a day (TID) | SUBCUTANEOUS | Status: DC
  Administered 2017-03-09: 18:00:00 10 [IU] via SUBCUTANEOUS
  Filled 2017-03-09 (×2): qty 10

## 2017-03-09 MED ORDER — ONDANSETRON HCL 4 MG PO TABS: 4.0000 mg | ORAL_TABLET | Freq: Four times a day (QID) | ORAL | Status: DC | PRN

## 2017-03-09 MED ORDER — INSULIN GLARGINE 100 UNIT/ML ~~LOC~~ SOLN
30.0000 [IU] | Freq: Two times a day (BID) | SUBCUTANEOUS | Status: DC
  Administered 2017-03-09 – 2017-03-10 (×3): 30 [IU] via SUBCUTANEOUS
  Filled 2017-03-09 (×5): qty 0.3

## 2017-03-09 MED ORDER — SODIUM CHLORIDE 0.9 % IV SOLN
Freq: Once | INTRAVENOUS | Status: AC
  Administered 2017-03-09: 10:00:00 via INTRAVENOUS

## 2017-03-09 MED ORDER — ONDANSETRON HCL 4 MG/2ML IJ SOLN
4.0000 mg | Freq: Four times a day (QID) | INTRAMUSCULAR | Status: DC | PRN
  Administered 2017-03-09 – 2017-03-10 (×2): 4 mg via INTRAVENOUS
  Filled 2017-03-09 (×2): qty 2

## 2017-03-09 MED ORDER — ACETAMINOPHEN 325 MG PO TABS: 650.0000 mg | ORAL_TABLET | Freq: Four times a day (QID) | ORAL | Status: DC | PRN

## 2017-03-09 MED ORDER — POLYETHYLENE GLYCOL 3350 17 G PO PACK: 17.0000 g | PACK | Freq: Every day | ORAL | Status: DC | PRN

## 2017-03-09 MED ORDER — CEFTRIAXONE SODIUM 1 G IJ SOLR
1.0000 g | INTRAMUSCULAR | Status: DC
  Administered 2017-03-10: 1 g via INTRAVENOUS
  Filled 2017-03-09 (×2): qty 10

## 2017-03-09 NOTE — ED Triage Notes (Signed)
Pt c/o LUQ pain with N/V for the past 2 weeks. States she has not been able to keep and food or liquids down for the whole 2 weeks.

## 2017-03-09 NOTE — ED Provider Notes (Signed)
[facility] Emergency Department Provider Note        Time seen: ----------------------------------------- 8:51 AM on 03/09/2017 -----------------------------------------    I have reviewed the triage vital signs and the nursing notes.   HISTORY  Chief Complaint Abdominal Pain    HPI Aimee Buck is a 56 y.o. female who presents to the ER with left upper quadrant pain with nausea vomiting for the past 2 weeks. Patient has not been able to keep food or liquids down for the whole 2 weeks.Patient reports pain is worse in the left side of her abdomen, nothing makes the pain better. She states she thinks she's had fever, and had diarrhea last week but since then she cannot move her bowels.   Past Medical History:  Diagnosis Date  . Asthma   . COPD (chronic obstructive pulmonary disease) ([place])   . Hypertension     Patient Active Problem List   Diagnosis Date Noted  . Hyperglycemia 10/14/2016    Past Surgical History:  Procedure Laterality Date  . ANKLE SURGERY Left     Allergies Iodine-131  Social History Social History  Substance Use Topics  . Smoking status: Current Every Day Smoker    Packs/day: 0.50    Types: Cigarettes  . Smokeless tobacco: Never Used  . Alcohol use 8.4 oz/week    14 Cans of beer per week    Review of Systems Constitutional: Negative for fever. Cardiovascular: Negative for chest pain. Respiratory: Negative for shortness of breath. Gastrointestinal: Positive for abdominal pain, vomiting Genitourinary: Negative for dysuria. Musculoskeletal: Negative for back pain. Skin: Negative for rash. Neurological: Negative for headaches, positive for weakness  10-point ROS otherwise negative.  ____________________________________________   PHYSICAL EXAM:  VITAL SIGNS: ED Triage Vitals [03/09/17 0840]  Enc Vitals Group     BP 91/64     Pulse Rate (!) 108     Resp (!) 24     Temp 99.5 F (37.5 C)     Temp  Source Oral     SpO2 97 %     Weight 270 lb (122.5 kg)     Height 5\' 6"  (1.676 m)     Head Circumference      Peak Flow      Pain Score 10     Pain Loc      Pain Edu?      Excl. in [place]?     Constitutional: Alert and oriented. Anxious, tearful, mild distress Eyes: Conjunctivae are normal. PERRL. Normal extraocular movements. ENT   Head: Normocephalic and atraumatic.   Nose: No congestion/rhinnorhea.   Mouth/Throat: Mucous membranes are moist.   Neck: No stridor. Cardiovascular: Normal rate, regular rhythm. No murmurs, rubs, or gallops. Respiratory: Normal respiratory effort without tachypnea nor retractions. Breath sounds are clear and equal bilaterally. No wheezes/rales/rhonchi. Gastrointestinal: Diffuse abdominal tenderness, worse in the left side. Normal bowel sounds. Musculoskeletal: Nontender with normal range of motion in all extremities. No lower extremity tenderness nor edema. Neurologic:  Normal speech and language. No gross focal neurologic deficits are appreciated.  Skin:  Skin is warm, dry and intact. No rash noted. Psychiatric: Depressed mood and affect ____________________________________________  EKG: Interpreted by me. Sinus tachycardia with a rate of 104 bpm, normal PR interval, normal QRS, normal QT.  ____________________________________________  ED COURSE:  Pertinent labs & imaging results that were available during my care of the patient were reviewed by me and considered in my medical decision making (see chart for details). Patient presents to  ER with abdominal pain as well as vomiting. We will assess with labs and likely CT imaging.   Procedures ____________________________________________   LABS (pertinent positives/negatives)  Labs Reviewed  CBC WITH DIFFERENTIAL/PLATELET - Abnormal; Notable for the following:       Result Value   MCV 100.8 (*)    MCH 34.6 (*)    RDW 15.6 (*)    Neutro Abs 7.5 (*)    Lymphs Abs 0.9 (*)    Monocytes  Absolute 2.0 (*)    All other components within normal limits  COMPREHENSIVE METABOLIC PANEL - Abnormal; Notable for the following:    Sodium 129 (*)    Chloride 93 (*)    Glucose, Bld 134 (*)    BUN 66 (*)    Creatinine, Ser 3.22 (*)    Total Protein 8.2 (*)    Albumin 3.3 (*)    GFR calc non Af Amer 15 (*)    GFR calc Af Amer 18 (*)    All other components within normal limits  URINALYSIS, COMPLETE (UACMP) WITH MICROSCOPIC - Abnormal; Notable for the following:    Color, Urine AMBER (*)    APPearance HAZY (*)    Hgb urine dipstick MODERATE (*)    Protein, ur 30 (*)    Bacteria, UA FEW (*)    Squamous Epithelial / LPF 0-5 (*)    All other components within normal limits  LIPASE, BLOOD    RADIOLOGY Images were viewed by me  CT of the abdomen and pelvis with contrast IMPRESSION: 1. Abnormal left perirenal stranding and mild prominence of the left kidney compared to the right. Appearance suggests an inflammatory process. There is no current hydronephrosis or urinary tract calculus identified. Correlate with urine analysis in assessing for pyelonephritis or other renal abnormality. 2. 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 3. Aortoiliac atherosclerotic vascular disease. 4. Descending and sigmoid colon diverticulosis. 5. Mild enlargement of the cardiopericardial silhouette. 6. Small type 1 hiatal hernia. ____________________________________________  FINAL ASSESSMENT AND PLAN  Abdominal pain, vomiting, Acute renal failure  Plan: Patient with labs and imaging as dictated above. Patient presents to ER with vomiting and abdominal pain. CT scan was reassuring except for possible pyelonephritis. I have ordered IV Rocephin for her. Currently her symptoms are much improved and she has received 2 L of saline. She is stable for admission at this time.   Earleen Newport, MD   Note:  This note was generated in part or whole with voice recognition software. Voice recognition is usually quite accurate but there are transcription errors that can and very often do occur. I apologize for any typographical errors that were not detected and corrected.     Earleen Newport, MD 03/09/17 [phone]

## 2017-03-09 NOTE — H&P (Signed)
[facility] [place] NAME: Aimee Buck    MR#:  [id number]  DATE OF BIRTH:  12/22/1960  DATE OF ADMISSION:  03/09/2017  PRIMARY CARE PHYSICIAN: Gennette Pac, FNP   REQUESTING/REFERRING PHYSICIAN: Dr. Jimmye Norman  CHIEF COMPLAINT:   Chief Complaint  Patient presents with  . Abdominal Pain    HISTORY OF PRESENT ILLNESS:  Aimee Buck  is a 56 y.o. female with a known history of Diabetes, COPD presents to the emergency room complaining of 10 days of nausea, vomiting and diarrhea. Diarrhea has improved. Continues to have vomiting. She also had left flank pain. Here patient has a few bacteria on her urinalysis but no significant WBC. CT scan of the abdomen pelvis showed left perinephric stranding. No abscess. Patient is being admitted for left pyelonephritis with acute kidney injury. No recent antibiotic use. No hematemesis or hematochezia.  PAST MEDICAL HISTORY:   Past Medical History:  Diagnosis Date  . Asthma   . COPD (chronic obstructive pulmonary disease) ([place])   . Diabetes ([place])   . Hypertension     PAST SURGICAL HISTORY:   Past Surgical History:  Procedure Laterality Date  . ANKLE SURGERY Left     SOCIAL HISTORY:   Social History  Substance Use Topics  . Smoking status: Current Every Day Smoker    Packs/day: 0.50    Types: Cigarettes  . Smokeless tobacco: Never Used  . Alcohol use 8.4 oz/week    14 Cans of beer per week    FAMILY HISTORY:   Family History  Problem Relation Age of Onset  . Hypertension Mother   . Hypertension Father   . CAD Father   . Breast cancer Neg Hx     DRUG ALLERGIES:   Allergies  Allergen Reactions  . Contrast Media [Iodinated Diagnostic Agents] Itching and Swelling    Previous contrast allergy as a young adult    REVIEW OF SYSTEMS:   Review of Systems  Constitutional: Positive for malaise/fatigue. Negative for chills, fever and weight loss.  HENT: Negative for  hearing loss and nosebleeds.   Eyes: Negative for blurred vision, double vision and pain.  Respiratory: Negative for cough, hemoptysis, sputum production, shortness of breath and wheezing.   Cardiovascular: Negative for chest pain, palpitations, orthopnea and leg swelling.  Gastrointestinal: Positive for abdominal pain, diarrhea, nausea and vomiting. Negative for constipation.  Genitourinary: Negative for dysuria and hematuria.  Musculoskeletal: Positive for back pain. Negative for falls and myalgias.  Skin: Negative for rash.  Neurological: Positive for weakness. Negative for dizziness, tremors, sensory change, speech change, focal weakness, seizures and headaches.  Endo/Heme/Allergies: Does not bruise/bleed easily.  Psychiatric/Behavioral: Negative for depression and memory loss. The patient is not nervous/anxious.     MEDICATIONS AT HOME:   Prior to Admission medications   Medication Sig Start Date End Date Taking? Authorizing Provider  atorvastatin (LIPITOR) 40 MG tablet Take 1 tablet (40 mg total) by mouth daily at 6 PM. 10/16/16  Yes Demetrios Loll, MD  insulin aspart (NOVOLOG) 100 UNIT/ML injection Inject 15 Units into the skin 3 (three) times daily with meals. 10/16/16  Yes Demetrios Loll, MD  insulin detemir (LEVEMIR) 100 UNIT/ML injection Inject 35 Units into the skin at bedtime.   Yes Historical Provider, MD  insulin glargine (LANTUS) 100 UNIT/ML injection Inject 0.3 mLs (30 Units total) into the skin 2 (two) times daily. Patient taking differently: Inject 35 Units into the skin at bedtime.  10/16/16  Yes Qing  Bridgett Larsson, MD  lisinopril (PRINIVIL,ZESTRIL) 10 MG tablet Take 10 mg by mouth daily.   Yes Historical Provider, MD  meloxicam (MOBIC) 15 MG tablet Take 15 mg by mouth daily.   Yes Historical Provider, MD  Fluticasone-Salmeterol (ADVAIR) 100-50 MCG/DOSE AEPB Inhale 1 puff into the lungs 2 (two) times daily.    Historical Provider, MD  gabapentin (NEURONTIN) 100 MG capsule Take 2 capsules  (200 mg total) by mouth 2 (two) times daily. Patient not taking: Reported on 03/09/2017 10/16/16   Demetrios Loll, MD     VITAL SIGNS:  Blood pressure 95/68, pulse 97, temperature 99.5 F (37.5 C), temperature source Oral, resp. rate (!) 23, height 5\' 6"  (1.676 m), weight 122.5 kg (270 lb), SpO2 90 %.  PHYSICAL EXAMINATION:  Physical Exam  GENERAL:  56 y.o.-year-old patient lying in the bed with no acute distress. Morbidly obese EYES: Pupils equal, round, reactive to light and accommodation. No scleral icterus. Extraocular muscles intact.  HEENT: Head atraumatic, normocephalic. Oropharynx and nasopharynx clear. No oropharyngeal erythema, moist oral mucosa  NECK:  Supple, no jugular venous distention. No thyroid enlargement, no tenderness.  LUNGS: Normal breath sounds bilaterally, no wheezing, rales, rhonchi. No use of accessory muscles of respiration.  CARDIOVASCULAR: S1, S2 normal. No murmurs, rubs, or gallops.  ABDOMEN: Soft, nondistended. Bowel sounds present. No organomegaly or mass. Left flank tenderness EXTREMITIES: No pedal edema, cyanosis, or clubbing. + 2 pedal & radial pulses b/l.   NEUROLOGIC: Cranial nerves II through XII are intact. No focal Motor or sensory deficits appreciated b/l PSYCHIATRIC: The patient is alert and oriented x 3. Good affect.  SKIN: No obvious rash, lesion, or ulcer.   LABORATORY PANEL:   CBC  Recent Labs Lab 03/09/17 0852  WBC 10.5  HGB 14.7  HCT 42.8  PLT 209   ------------------------------------------------------------------------------------------------------------------  Chemistries   Recent Labs Lab 03/09/17 0852  NA 129*  K 3.9  CL 93*  CO2 24  GLUCOSE 134*  BUN 66*  CREATININE 3.22*  CALCIUM 9.0  AST 25  ALT 15  ALKPHOS 69  BILITOT 0.9   ------------------------------------------------------------------------------------------------------------------  Cardiac Enzymes No results for input(s): TROPONINI in the last 168  hours. ------------------------------------------------------------------------------------------------------------------  RADIOLOGY:  Ct Abdomen Pelvis Wo Contrast  Result Date: 03/09/2017 CLINICAL DATA:  Left upper quadrant abdominal pain with nausea vomiting for the past 2 weeks. EXAM: CT ABDOMEN AND PELVIS WITHOUT CONTRAST TECHNIQUE: Multidetector CT imaging of the abdomen and pelvis was performed following the standard protocol without IV contrast. COMPARISON:  Ultrasound dated 10/15/2016 FINDINGS: Lower chest: Mild enlargement of the cardiopericardial silhouette with mild calcification of the mitral valve. Small type 1 hiatal hernia. Hepatobiliary: Mildly contracted gallbladder. Pancreas: Unremarkable Spleen: Unremarkable Adrenals/Urinary Tract: Abnormal mild generalized prominence of left kidney along with left perirenal stranding which is asymmetric. No urinary tract calculi observed. No urinary bladder wall thickening. Stomach/Bowel: Descending and sigmoid colon diverticulosis. Slight wall thickening in portions of the ascending colon is probably from nondistention. Vascular/Lymphatic: Mild aortoiliac atherosclerotic vascular calcification. Small retroperitoneal lymph nodes are not pathologically enlarged by size criteria. Reproductive: Unremarkable Other: No supplemental non-categorized findings. 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 IMPRESSION: 1. Abnormal left perirenal stranding and mild prominence of the left kidney compared to the right. Appearance suggests an inflammatory process. There is no current hydronephrosis or urinary tract calculus identified. Correlate with urine analysis in assessing for pyelonephritis or other renal  abnormality. 2. 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 3.  Aortoiliac atherosclerotic vascular disease. 4. Descending and sigmoid colon diverticulosis. 5. Mild enlargement of the cardiopericardial silhouette. 6. Small type 1 hiatal hernia. Electronically Signed   By: Van Clines M.D.   On: 03/09/2017 11:46     IMPRESSION AND PLAN:   * Left pyelonephritis IV fluid bolus. Maintenance fluids. Start IV ceftriaxone. Send for urine cultures. Pain medications as needed Check lactic acid  * Acute kidney injury due to dehydration over CKD stage III No renal obstruction on CT scan. IV fluids. Monitor input and output.  * Nausea and vomiting likely due to pyelonephritis. Nothing acute on CT scan. Normal lipase  * Insulin-dependent diabetes mellitus. Continue Lantus at lower dose of 30 units. Pre-meal insulin. Sliding scale insulin.  * COPD. Albuterol when necessary. Continue home inhalers.  * DVT prophylaxis with heparin  All the records are reviewed and case discussed with ED provider. Management plans discussed with the patient, family and they are in agreement.  CODE STATUS: FULL CODE  TOTAL TIME TAKING CARE OF THIS PATIENT: 40 minutes.   Hillary Bow R M.D on 03/09/2017 at 1:30 PM  Between 7am to 6pm - Pager - [phone]  After 6pm go to [email or web address] - password [email or web address] [facility]  Office  [phone]  CC: Primary care physician; Gennette Pac, FNP  Note: This dictation was prepared with Dragon dictation along with smaller phrase technology. Any transcriptional errors that result from this process are unintentional.

## 2017-03-10 LAB — CBC
HEMATOCRIT: 36.9 % (ref 35.0–47.0)
HEMOGLOBIN: 12.5 g/dL (ref 12.0–16.0)
MCH: 34.8 pg — ABNORMAL HIGH (ref 26.0–34.0)
MCHC: 33.8 g/dL (ref 32.0–36.0)
MCV: 102.9 fL — AB (ref 80.0–100.0)
Platelets: 180 10*3/uL (ref 150–440)
RBC: 3.59 MIL/uL — AB (ref 3.80–5.20)
RDW: 15.3 % — ABNORMAL HIGH (ref 11.5–14.5)
WBC: 8 10*3/uL (ref 3.6–11.0)

## 2017-03-10 LAB — BASIC METABOLIC PANEL
ANION GAP: 9 (ref 5–15)
BUN: 43 mg/dL — ABNORMAL HIGH (ref 6–20)
CHLORIDE: 101 mmol/L (ref 101–111)
CO2: 22 mmol/L (ref 22–32)
Calcium: 8.1 mg/dL — ABNORMAL LOW (ref 8.9–10.3)
Creatinine, Ser: 1.71 mg/dL — ABNORMAL HIGH (ref 0.44–1.00)
GFR calc Af Amer: 38 mL/min — ABNORMAL LOW (ref >60)
GFR calc non Af Amer: 33 mL/min — ABNORMAL LOW (ref >60)
Glucose, Bld: 96 mg/dL (ref 65–99)
POTASSIUM: 3.7 mmol/L (ref 3.5–5.1)
SODIUM: 132 mmol/L — AB (ref 135–145)

## 2017-03-10 LAB — URINE CULTURE: Special Requests: NORMAL

## 2017-03-10 LAB — HEMOGLOBIN A1C
HEMOGLOBIN A1C: 6.5 % — AB (ref 4.8–5.6)
MEAN PLASMA GLUCOSE: 140 mg/dL

## 2017-03-10 LAB — GLUCOSE, CAPILLARY
GLUCOSE-CAPILLARY: 109 mg/dL — AB (ref 65–99)
GLUCOSE-CAPILLARY: 135 mg/dL — AB (ref 65–99)
Glucose-Capillary: 106 mg/dL — ABNORMAL HIGH (ref 65–99)
Glucose-Capillary: 104 mg/dL — ABNORMAL HIGH (ref 65–99)

## 2017-03-10 MED ORDER — SODIUM CHLORIDE 0.9 % IV SOLN
INTRAVENOUS | Status: DC
  Administered 2017-03-10 – 2017-03-11 (×3): via INTRAVENOUS

## 2017-03-10 MED ORDER — METOCLOPRAMIDE HCL 5 MG/ML IJ SOLN
5.0000 mg | Freq: Three times a day (TID) | INTRAMUSCULAR | Status: DC
  Administered 2017-03-10 – 2017-03-11 (×3): 5 mg via INTRAVENOUS
  Filled 2017-03-10 (×3): qty 2

## 2017-03-10 NOTE — Progress Notes (Signed)
[facility] [place] NAME: Aimee Buck    MR#:  [id number]  DATE OF BIRTH:  December 05, 1961  SUBJECTIVE: Patient admitted for pyelonephritis. Started on IV Rocephin, IV fluids. Today she feels slightly better. No flank pain but still has nausea, vomiting.   CHIEF COMPLAINT:   Chief Complaint  Patient presents with  . Abdominal Pain    REVIEW OF SYSTEMS:   ROS CONSTITUTIONAL: No fever, fatigue or weakness.  EYES: No blurred or double vision.  EARS, NOSE, AND THROAT: No tinnitus or ear pain.  RESPIRATORY: No cough, shortness of breath, wheezing or hemoptysis.  CARDIOVASCULAR: No chest pain, orthopnea, edema.  GASTROINTESTINALNausea and vomiting today., no diarrhea or abdominal pain.  GENITOURINARY: No dysuria, hematuria.  ENDOCRINE: No polyuria, nocturia,  HEMATOLOGY: No anemia, easy bruising or bleeding SKIN: No rash or lesion. MUSCULOSKELETAL: No joint pain or arthritis.   NEUROLOGIC: No tingling, numbness, weakness.  PSYCHIATRY: No anxiety or depression.   DRUG ALLERGIES:   Allergies  Allergen Reactions  . Contrast Media [Iodinated Diagnostic Agents] Itching and Swelling    Previous contrast allergy as a young adult    VITALS:  Blood pressure 108/64, pulse 92, temperature 99.9 F (37.7 C), resp. rate 19, height 5\' 6"  (1.676 m), weight 134.2 kg (295 lb 14.4 oz), SpO2 96 %.  PHYSICAL EXAMINATION:  GENERAL:  56 y.o.-year-old patient lying in the bed with no acute distress.  EYES: Pupils equal, round, reactive to light and accommodation. No scleral icterus. Extraocular muscles intact.  HEENT: Head atraumatic, normocephalic. Oropharynx and nasopharynx clear.  NECK:  Supple, no jugular venous distention. No thyroid enlargement, no tenderness.  LUNGS: Normal breath sounds bilaterally, no wheezing, rales,rhonchi or crepitation. No use of accessory muscles of respiration.  CARDIOVASCULAR: S1, S2 normal. No murmurs, rubs, or  gallops.  ABDOMEN: Soft, nontender, nondistended. Bowel sounds present. No organomegaly or mass.  EXTREMITIES: No pedal edema, cyanosis, or clubbing.  NEUROLOGIC: Cranial nerves II through XII are intact. Muscle strength 5/5 in all extremities. Sensation intact. Gait not checked.  PSYCHIATRIC: The patient is alert and oriented x 3.  SKIN: No obvious rash, lesion, or ulcer.    LABORATORY PANEL:   CBC  Recent Labs Lab 03/10/17 0649  WBC 8.0  HGB 12.5  HCT 36.9  PLT 180   ------------------------------------------------------------------------------------------------------------------  Chemistries   Recent Labs Lab 03/09/17 0852 03/10/17 0649  NA 129* 132*  K 3.9 3.7  CL 93* 101  CO2 24 22  GLUCOSE 134* 96  BUN 66* 43*  CREATININE 3.22* 1.71*  CALCIUM 9.0 8.1*  AST 25  --   ALT 15  --   ALKPHOS 69  --   BILITOT 0.9  --    ------------------------------------------------------------------------------------------------------------------  Cardiac Enzymes No results for input(s): TROPONINI in the last 168 hours. ------------------------------------------------------------------------------------------------------------------  RADIOLOGY:  Ct Abdomen Pelvis Wo Contrast  Result Date: 03/09/2017 CLINICAL DATA:  Left upper quadrant abdominal pain with nausea vomiting for the past 2 weeks. EXAM: CT ABDOMEN AND PELVIS WITHOUT CONTRAST TECHNIQUE: Multidetector CT imaging of the abdomen and pelvis was performed following the standard protocol without IV contrast. COMPARISON:  Ultrasound dated 10/15/2016 FINDINGS: Lower chest: Mild enlargement of the cardiopericardial silhouette with mild calcification of the mitral valve. Small type 1 hiatal hernia. Hepatobiliary: Mildly contracted gallbladder. Pancreas: Unremarkable Spleen: Unremarkable Adrenals/Urinary Tract: Abnormal mild generalized prominence of left kidney along with left perirenal stranding which is asymmetric. No urinary  tract calculi observed. No urinary bladder  wall thickening. Stomach/Bowel: Descending and sigmoid colon diverticulosis. Slight wall thickening in portions of the ascending colon is probably from nondistention. Vascular/Lymphatic: Mild aortoiliac atherosclerotic vascular calcification. Small retroperitoneal lymph nodes are not pathologically enlarged by size criteria. Reproductive: Unremarkable Other: No supplemental non-categorized findings. 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 IMPRESSION: 1. Abnormal left perirenal stranding and mild prominence of the left kidney compared to the right. Appearance suggests an inflammatory process. There is no current hydronephrosis or urinary tract calculus identified. Correlate with urine analysis in assessing for pyelonephritis or other renal abnormality. 2. 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 3.  Aortoiliac atherosclerotic vascular disease. 4. Descending and sigmoid colon diverticulosis. 5. Mild enlargement of the cardiopericardial silhouette. 6. Small type 1 hiatal hernia. Electronically Signed   By: Van Clines M.D.   On: 03/09/2017 11:46    EKG:   Orders placed or performed during the hospital encounter of 03/09/17  . EKG 12-Lead  . EKG 12-Lead    ASSESSMENT AND PLAN:   #1 left-sided pyelonephritis: Continue Rocephin, IV hydration, clinically improving, likely discharge home tomorrow with by mouth antibiotics. #2 acute on chronic renal failure with CK distress history: Kidney function is improving, creatinine down from 3-1.7. However patient has nausea, vomiting,  Concerned  that patient may have recurrent renal failure so I'm going to keep her one more day in the hospital, continue hydration and patient is agreeable to this  plan.   #Hyponatremia improving #4 diabetes mellitus type 2: Continue present dose of insulin.   All the records are reviewed and case discussed with Care Management/Social Workerr. Management plans discussed with the patient, family and they are in agreement.  CODE STATUS: full  TOTAL TIME TAKING CARE OF THIS PATIENT: 35 minutes.   POSSIBLE D/C IN 1-2DAYS, DEPENDING ON CLINICAL CONDITION.   Epifanio Lesches M.D on 03/10/2017 at 2:25 PM  Between 7am to 6pm - Pager - [phone]  After 6pm go to [email or web address] - password [email or web address] [facility]  Office  [phone]  CC: Primary care physician; Gennette Pac, FNP   Note: This dictation was prepared with Dragon dictation along with smaller phrase technology. Any transcriptional errors that result from this process are unintentional.

## 2017-03-10 NOTE — Plan of Care (Addendum)
Patient continues to vomitt.  Dr. Text to inform and request another PRN for nausea. Orders placed. Patient assisted to brush teeth. Given cool washcloth, trash emptied in room and brought warm ginger ale to sip.

## 2017-03-10 NOTE — Progress Notes (Signed)
Initial Nutrition Assessment  DOCUMENTATION CODES:   Morbid obesity  INTERVENTION:  Provide snacks po BID between meals. RD has ordered.  NUTRITION DIAGNOSIS:   Inadequate oral intake related to poor appetite, nausea, vomiting as evidenced by per patient/family report.  GOAL:   Patient will meet greater than or equal to 90% of their needs  MONITOR:   PO intake, Labs, Weight trends, I & O's  REASON FOR ASSESSMENT:   Malnutrition Screening Tool    ASSESSMENT:   56 year old female with PMHx of DM type 2, HTN, COPD who presents with N/V and diarrhea, found to have left pyelonephritis, AKI on CKD III.    Spoke with patient at bedside. She reports her appetite was poor for 2 weeks PTA in setting of N/V, diarrhea, abdominal pain. She reports she did not eat anything those two weeks because she would have post-prandial emesis, even with water. Patient reports her appetite is good now as her symptoms are well-managed and she is tolerating her diet well. Patient reports she follows a CHO Mod diet at home and does not have any questions about it. Patient reports she does not need an oral nutrition supplement as her appetite is good now. She is amenable to eating snacks between meals.  Reports UBW 290 lbs recently. Patient reports when they weighed her on a bed scale yesterday she was 262 lbs so she believes she has lost 30 lbs in 2 weeks, which would be significant for time frame. RD obtained bed scale weight of 295.9 lbs with one pillow and one blanket, unsure if patient experienced weight loss or if one bed scale is inaccurate.  Meal Completion: 100% of CHO Modified diet today  Medications reviewed and include: ceftriaxone, Novolog sliding scale TID with meals, Novolog 10 units TID with meals, Lantus 30 units BID, NS with KCl 20 mEq/L @ 125 ml/hr.  Labs reviewed: CBG 104-130 past 24 hrs, Sodium 132, BUN 43, Creatinine 1.71.   Nutrition-Focused physical exam completed. Findings are no  fat depletion, no muscle depletion, and no edema.   Diet Order:  Diet Carb Modified Fluid consistency: Thin; Room service appropriate? Yes  Skin:  Reviewed, no issues  Last BM:  03/09/2017  Height:   Ht Readings from Last 1 Encounters:  03/09/17 5\' 6"  (1.676 m)    Weight:   Wt Readings from Last 1 Encounters:  03/10/17 295 lb 14.4 oz (134.2 kg)    Ideal Body Weight:  59.1 kg  BMI:  Body mass index is 47.76 kg/m.  Estimated Nutritional Needs:   Kcal:  2400-2600  Protein:  110-120 grams  Fluid:  1.8 L/day  EDUCATION NEEDS:   No education needs identified at this time  Willey Blade, MS, RD, LDN Pager: [phone] After Hours Pager: [phone]

## 2017-03-11 LAB — GLUCOSE, CAPILLARY: Glucose-Capillary: 108 mg/dL — ABNORMAL HIGH (ref 65–99)

## 2017-03-11 MED ORDER — CEPHALEXIN 500 MG PO CAPS: 500.0000 mg | ORAL_CAPSULE | Freq: Two times a day (BID) | ORAL | 0 refills | Status: DC

## 2017-03-11 MED ORDER — ONDANSETRON HCL 4 MG PO TABS: 4.0000 mg | ORAL_TABLET | Freq: Three times a day (TID) | ORAL | 0 refills | Status: DC | PRN

## 2017-03-11 NOTE — Progress Notes (Signed)
Pt for discharge home. A/o. No  n/v/d noted this am.  Denies pain.  Pt removed sl per self site  Clean and intact.  Discharge instructions discussed with pt. presc  And home meds discussed. Diet activity and f/u discussed.  Verbalized understanding. Home  Via w/c at this time  With nurse and student nurse jeanne.

## 2017-03-11 NOTE — Progress Notes (Signed)
     Aimee Buck was admitted to the Hospital on 03/09/2017 and Discharged  03/11/2017 and should be excused from work/school   for 5 days starting 03/09/2017 , may return to work/school without any restrictions.    Epifanio Lesches M.D on 03/11/2017,at 7:55 AM

## 2017-03-11 NOTE — Discharge Instructions (Signed)
Acute Kidney Injury, Adult Acute kidney injury is a sudden worsening of kidney function. The kidneys are organs that have several jobs. They filter the blood to remove waste products and extra fluid. They also maintain a healthy balance of minerals and hormones in the body, which helps control blood pressure and keep bones strong. With this condition, your kidneys do not do their jobs as well as they should. This condition ranges from mild to severe. Over time it may develop into long-lasting (chronic) kidney disease. Early detection and treatment may prevent acute kidney injury from developing into a chronic condition. What are the causes? Common causes of this condition include:  A problem with blood flow to the kidneys. This may be caused by:  Low blood pressure (hypotension) or shock.  Blood loss.  Heart and blood vessel (cardiovascular) disease.  Severe burns.  Liver disease.  Direct damage to the kidneys. This may be caused by:  Certain medicines.  A kidney infection.  Poisoning.  Being around or in contact with toxic substances.  A surgical wound.  A hard, direct hit to the kidney area.  A sudden blockage of urine flow. This may be caused by:  Cancer.  Kidney stones.  An enlarged prostate in males. What are the signs or symptoms? Symptoms of this condition may not be obvious until the condition becomes severe. Symptoms of this condition can include:  Tiredness (lethargy), or difficulty staying awake.  Nausea or vomiting.  Swelling (edema) of the face, legs, ankles, or feet.  Problems with urination, such as:  Abdominal pain, or pain along the side of your stomach (flank).  Decreased urine production.  Decrease in the force of urine flow.  Muscle twitches and cramps, especially in the legs.  Confusion or trouble concentrating.  Loss of appetite.  Fever. How is this diagnosed? This condition may be diagnosed with tests, including:  Blood  tests.  Urine tests.  Imaging tests.  A test in which a sample of tissue is removed from the kidneys to be examined under a microscope (kidney biopsy). How is this treated? Treatment for this condition depends on the cause and how severe the condition is. In mild cases, treatment may not be needed. The kidneys may heal on their own. In more severe cases, treatment will involve:  Treating the cause of the kidney injury. This may involve changing any medicines you are taking or adjusting your dosage.  Fluids. You may need specialized IV fluids to balance your body's needs.  Having a catheter placed to drain urine and prevent blockages.  Preventing problems from occurring. This may mean avoiding certain medicines or procedures that can cause further injury to the kidneys. In some cases treatment may also require:  A procedure to remove toxic wastes from the body (dialysis or continuous renal replacement therapy - CRRT).  Surgery. This may be done to repair a torn kidney, or to remove the blockage from the urinary system. Follow these instructions at home: Medicines   Take over-the-counter and prescription medicines only as told by your health care provider.  Do not take any new medicines without your health care provider's approval. Many medicines can worsen your kidney damage.  Do not take any vitamin and mineral supplements without your health care provider's approval. Many nutritional supplements can worsen your kidney damage. Lifestyle   If your health care provider prescribed changes to your diet, follow them. You may need to decrease the amount of protein you eat.  Achieve and maintain a   healthy weight. If you need help with this, ask your health care provider.  Start or continue an exercise plan. Try to exercise at least 30 minutes a day, 5 days a week.  Do not use any tobacco products, such as cigarettes, chewing tobacco, and e-cigarettes. If you need help quitting, ask  your health care provider. General instructions   Keep track of your blood pressure. Report changes in your blood pressure as told by your health care provider.  Stay up to date with immunizations. Ask your health care provider which immunizations you need.  Keep all follow-up visits as told by your health care provider. This is important. Where to find more information:  American Association of Kidney Patients: www.aakp.org  National Kidney Foundation: www.kidney.org  American Kidney Fund: www.akfinc.org  [employer] [facility]:  www.lifeoptions.org  www.kidneyschool.org Contact a health care provider if:  Your symptoms get worse.  You develop new symptoms. Get help right away if:  You develop symptoms of worsening kidney disease, which include:  Headaches.  Abnormally dark or light skin.  Easy bruising.  Frequent hiccups.  Chest pain.  Shortness of breath.  End of menstruation in women.  Seizures.  Confusion or altered mental status.  Abdominal or back pain.  Itchiness.  You have a fever.  Your body is producing less urine.  You have pain or bleeding when you urinate. Summary  Acute kidney injury is a sudden worsening of kidney function.  Acute kidney injury can be caused by problems with blood flow to the kidneys, direct damage to the kidneys, and sudden blockage of urine flow.  Symptoms of this condition may not be obvious until it becomes severe. Symptoms may include edema, lethargy, confusion, nausea or vomiting, and problems passing urine.  This condition can usually be diagnosed with blood tests, urine tests, and imaging tests. Sometimes a kidney biopsy is done to diagnose this condition.  Treatment for this condition often involves treating the underlying cause. It is treated with fluids, medicines, dialysis, diet changes, or surgery. This information is not intended to replace advice given to you by your health care provider.  Make sure you discuss any questions you have with your health care provider. Document Released: 06/21/2011 Document Revised: 11/26/2016 Document Reviewed: 11/26/2016 Elsevier Interactive Patient Education  2017 Elsevier Inc.  

## 2017-03-14 NOTE — Discharge Summary (Signed)
Aimee Buck, is a 56 y.o. female  DOB 01-29-1961  MRN [id number].  Admission date:  03/09/2017  Admitting Physician  Hillary Bow, MD  Discharge Date:  03/11/2017   Primary MD  Gennette Pac, FNP  Recommendations for primary care physician for things to follow:   Follow-up with primary doctor in one week    Admission Diagnosis  Left upper quadrant pain [R10.12] Acute renal failure, unspecified acute renal failure type [facility]) [N17.9]   Discharge Diagnosis  Left upper quadrant pain [R10.12] Acute renal failure, unspecified acute renal failure type ([place]) [N17.9]    Active Problems:   Pyelonephritis      Past Medical History:  Diagnosis Date  . Asthma   . COPD (chronic obstructive pulmonary disease) ([place])   . Diabetes ([place])   . Hypertension     Past Surgical History:  Procedure Laterality Date  . ANKLE SURGERY Left        History of present illness and  Hospital Course:     Kindly see H&P for history of present illness and admission details, please review complete Labs, Consult reports and Test reports for all details in brief  HPI  from the history and physical done on the day of admission 56 year old patient admitted for severe left flank pain, nausea, vomiting and diarrhea. Admitted for acute pyelonephritis.   Hospital Course   Acute pyelonephritis: Patient CT of abdomen showed left perinephric stranding but no abscess. Started on IV antibiotics IV fluids, and cultures showed maxillary species. Discharged home with Keflex for 5 days.   #2 acute kidney injury secondary to acute pyelonephritis, nausea, vomiting, dehydration: Patient creatinine on admission 3.22, BUN 66, GFR 18. With hydration patient kidney function improved patient does have chronic kidney disease stage III, creatinine at the  time of discharge 1.71, BUN 43, GFR 48.  #3. diabetes mellitus type 2: And takes Lantus 30 units twice a day.  4.,Nausea,vomiting; due to the acute pyeloNephritis,, acute kidney injury improved with IV hydration, IV Zofran, Reglan.  Discharge Condition:stable   Follow UP  Follow-up Information    Gennette Pac, FNP. Go on 03/23/2017.   Specialty:  Family Medicine Why:  @3 :40 PM Contact information: [address] [place] [address] [phone]             Discharge Instructions  and  Discharge Medications      Allergies as of 03/11/2017      Reactions   Contrast Media [iodinated Diagnostic Agents] Itching, Swelling   Previous contrast allergy as a young adult      Medication List    STOP taking these medications   insulin detemir 100 UNIT/ML injection Commonly known as:  LEVEMIR     TAKE these medications   aspirin EC 81 MG tablet Take 81 mg by mouth daily.   atorvastatin 40 MG tablet Commonly known as:  LIPITOR Take 1 tablet (40 mg total) by mouth daily at 6 PM.   cephALEXin 500 MG capsule Commonly known as:  KEFLEX Take 1 capsule (500 mg total) by mouth 2 (two) times daily.   Fluticasone-Salmeterol 100-50 MCG/DOSE Aepb Commonly known as:  ADVAIR Inhale 1 puff into the lungs 2 (two) times daily.   gabapentin 100 MG capsule Commonly known as:  NEURONTIN Take 2 capsules (200 mg total) by mouth 2 (two) times daily.   insulin aspart 100 UNIT/ML injection Commonly known as:  novoLOG Inject 15 Units into the skin 3 (three) times daily with meals.   insulin  glargine 100 UNIT/ML injection Commonly known as:  LANTUS Inject 0.3 mLs (30 Units total) into the skin 2 (two) times daily. What changed:  how much to take  when to take this   lisinopril 10 MG tablet Commonly known as:  PRINIVIL,ZESTRIL Take 10 mg by mouth daily.   meloxicam 15 MG tablet Commonly known as:  MOBIC Take 15 mg by mouth daily.   ondansetron 4 MG tablet Commonly known  as:  ZOFRAN Take 1 tablet (4 mg total) by mouth every 8 (eight) hours as needed for nausea or vomiting.         Diet and Activity recommendation: See Discharge Instructions above   Consults obtained -none   Major procedures and Radiology Reports - PLEASE review detailed and final reports for all details, in brief -     Ct Abdomen Pelvis Wo Contrast  Result Date: 03/09/2017 CLINICAL DATA:  Left upper quadrant abdominal pain with nausea vomiting for the past 2 weeks. EXAM: CT ABDOMEN AND PELVIS WITHOUT CONTRAST TECHNIQUE: Multidetector CT imaging of the abdomen and pelvis was performed following the standard protocol without IV contrast. COMPARISON:  Ultrasound dated 10/15/2016 FINDINGS: Lower chest: Mild enlargement of the cardiopericardial silhouette with mild calcification of the mitral valve. Small type 1 hiatal hernia. Hepatobiliary: Mildly contracted gallbladder. Pancreas: Unremarkable Spleen: Unremarkable Adrenals/Urinary Tract: Abnormal mild generalized prominence of left kidney along with left perirenal stranding which is asymmetric. No urinary tract calculi observed. No urinary bladder wall thickening. Stomach/Bowel: Descending and sigmoid colon diverticulosis. Slight wall thickening in portions of the ascending colon is probably from nondistention. Vascular/Lymphatic: Mild aortoiliac atherosclerotic vascular calcification. Small retroperitoneal lymph nodes are not pathologically enlarged by size criteria. Reproductive: Unremarkable Other: No supplemental non-categorized findings. 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 IMPRESSION: 1. Abnormal left perirenal stranding and mild prominence of the left kidney compared to the right. Appearance suggests an inflammatory process. There is no current hydronephrosis or urinary tract calculus identified.  Correlate with urine analysis in assessing for pyelonephritis or other renal abnormality. 2. 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 3.  Aortoiliac atherosclerotic vascular disease. 4. Descending and sigmoid colon diverticulosis. 5. Mild enlargement of the cardiopericardial silhouette. 6. Small type 1 hiatal hernia. Electronically Signed   By: Van Clines M.D.   On: 03/09/2017 11:46    Micro Results    Recent Results (from the past 240 hour(s))  Urine culture     Status: Abnormal   Collection Time: 03/09/17 10:14 AM  Result Value Ref Range Status   Specimen Description URINE, RANDOM  Final   Special Requests Normal  Final   Culture MULTIPLE SPECIES PRESENT, SUGGEST RECOLLECTION (A)  Final   Report Status 03/10/2017 FINAL  Final       Today   Subjective:   Aimee Buck today has noAbdominal pain, no nausea, no vomiting.  Objective:   Blood pressure 132/88, pulse 81, temperature 98.6 F (37 C), temperature source Oral, resp. rate 18, height 5\' 6"  (1.676 m), weight 134.2 kg (295 lb 14.4 oz), SpO2 97 %.  No intake or output data in the 24 hours ending 03/14/17 1445  Exam Awake Alert, Oriented x 3, No new F.N deficits, Normal affect [place].AT,PERRAL Supple Neck,No JVD, No cervical lymphadenopathy appriciated.  Symmetrical Chest wall movement, Good air movement bilaterally, CTAB RRR,No Gallops,Rubs or new Murmurs,  No Parasternal Heave +ve B.Sounds, Abd Soft, Non tender, No organomegaly appriciated, No rebound -guarding or rigidity. No Cyanosis, Clubbing or edema, No new Rash or bruise  Data Review   CBC w Diff:  Lab Results  Component Value Date   WBC 8.0 03/10/2017   HGB 12.5 03/10/2017   HGB 14.3 04/19/2012   HCT 36.9 03/10/2017   HCT 44.8 04/19/2012   PLT 180 03/10/2017   PLT 200 04/19/2012   LYMPHOPCT 9 03/09/2017   LYMPHOPCT 20.9 04/19/2012   MONOPCT 19 03/09/2017    MONOPCT 8.1 04/19/2012   EOSPCT 0 03/09/2017   EOSPCT 2.1 04/19/2012   BASOPCT 0 03/09/2017   BASOPCT 0.3 04/19/2012    CMP:  Lab Results  Component Value Date   NA 132 (L) 03/10/2017   NA 142 04/19/2012   K 3.7 03/10/2017   K 4.5 04/19/2012   CL 101 03/10/2017   CL 107 04/19/2012   CO2 22 03/10/2017   CO2 27 04/19/2012   BUN 43 (H) 03/10/2017   BUN 11 04/19/2012   CREATININE 1.71 (H) 03/10/2017   CREATININE 0.94 04/19/2012   PROT 8.2 (H) 03/09/2017   PROT 7.6 04/18/2012   ALBUMIN 3.3 (L) 03/09/2017   ALBUMIN 3.1 (L) 04/18/2012   BILITOT 0.9 03/09/2017   BILITOT 0.5 04/18/2012   ALKPHOS 69 03/09/2017   ALKPHOS 86 04/18/2012   AST 25 03/09/2017   AST 19 04/18/2012   ALT 15 03/09/2017   ALT 18 04/18/2012  .   Total Time in preparing paper work, data evaluation and todays exam - 39 minutes  Aimee Buck M.D on 03/11/2017 at 2:45 PM    Note: This dictation was prepared with Dragon dictation along with smaller phrase technology. Any transcriptional errors that result from this process are unintentional.

## 2017-05-31 ENCOUNTER — Ambulatory Visit: Payer: Self-pay | Admitting: Pharmacy Technician

## 2017-05-31 NOTE — Progress Notes (Signed)
Patient scheduled for eligibility appointment at [facility].  Patient did not show for the appointment on May 31, 2017 at 9:00a.m.  Patient did not reschedule eligibility appointment.  [facility] unable to provide additional medication assistance until eligibility is determined.  [facility]

## 2017-07-05 ENCOUNTER — Emergency Department
Admission: EM | Admit: 2017-07-05 | Discharge: 2017-07-05 | Disposition: A | Discharge status: Home / Self Care | Payer: Medicaid Other | Attending: Emergency Medicine | Admitting: Emergency Medicine

## 2017-07-05 ENCOUNTER — Emergency Department: Payer: Medicaid Other

## 2017-07-05 ENCOUNTER — Encounter: Payer: Self-pay | Admitting: Emergency Medicine

## 2017-07-05 DIAGNOSIS — Z794 Long term (current) use of insulin: Secondary | ICD-10-CM | POA: Insufficient documentation

## 2017-07-05 DIAGNOSIS — J45909 Unspecified asthma, uncomplicated: Secondary | ICD-10-CM | POA: Diagnosis not present

## 2017-07-05 DIAGNOSIS — M25562 Pain in left knee: Secondary | ICD-10-CM | POA: Insufficient documentation

## 2017-07-05 DIAGNOSIS — I10 Essential (primary) hypertension: Secondary | ICD-10-CM | POA: Diagnosis not present

## 2017-07-05 DIAGNOSIS — Z791 Long term (current) use of non-steroidal anti-inflammatories (NSAID): Secondary | ICD-10-CM | POA: Insufficient documentation

## 2017-07-05 DIAGNOSIS — F17210 Nicotine dependence, cigarettes, uncomplicated: Secondary | ICD-10-CM | POA: Diagnosis not present

## 2017-07-05 DIAGNOSIS — E119 Type 2 diabetes mellitus without complications: Secondary | ICD-10-CM | POA: Diagnosis not present

## 2017-07-05 DIAGNOSIS — J449 Chronic obstructive pulmonary disease, unspecified: Secondary | ICD-10-CM | POA: Diagnosis not present

## 2017-07-05 DIAGNOSIS — Z79899 Other long term (current) drug therapy: Secondary | ICD-10-CM | POA: Insufficient documentation

## 2017-07-05 DIAGNOSIS — M25561 Pain in right knee: Secondary | ICD-10-CM | POA: Diagnosis present

## 2017-07-05 DIAGNOSIS — Z7982 Long term (current) use of aspirin: Secondary | ICD-10-CM | POA: Insufficient documentation

## 2017-07-05 MED ORDER — TRAMADOL HCL 50 MG PO TABS
50.0000 mg | ORAL_TABLET | Freq: Once | ORAL | Status: AC
  Administered 2017-07-05: 50 mg via ORAL
  Filled 2017-07-05: qty 1

## 2017-07-05 MED ORDER — MELOXICAM 7.5 MG PO TABS: 7.5000 mg | ORAL_TABLET | Freq: Every day | ORAL | 1 refills | Status: AC

## 2017-07-05 MED ORDER — TRAMADOL HCL 50 MG PO TABS: 50.0000 mg | ORAL_TABLET | Freq: Three times a day (TID) | ORAL | 0 refills | Status: AC | PRN

## 2017-07-05 NOTE — ED Provider Notes (Signed)
[facility] Emergency Department Provider Note  ____________________________________________  Time seen: Approximately 7:01 PM  I have reviewed the triage vital signs and the nursing notes.   HISTORY  Chief Complaint Knee Pain    HPI Aimee Buck is a 56 y.o. female presenting to the emergency department with 10 out of 10 bilateral knee pain after a fall 3 days ago. No loss of consciousness. Patient's pain is worsened with prolonged standing and relieved with rest. Patient has been ambulating. She denies radiculopathy, weakness or changes in sensation of the lower extremities. No alleviating measures have been attempted.    Past Medical History:  Diagnosis Date  . Asthma   . COPD (chronic obstructive pulmonary disease) ([place])   . Diabetes ([place])   . Hypertension     Patient Active Problem List   Diagnosis Date Noted  . Pyelonephritis 03/09/2017  . Hyperglycemia 10/14/2016    Past Surgical History:  Procedure Laterality Date  . ANKLE SURGERY Left     Prior to Admission medications   Medication Sig Start Date End Date Taking? Authorizing Provider  aspirin EC 81 MG tablet Take 81 mg by mouth daily.    [provider]  atorvastatin (LIPITOR) 40 MG tablet Take 1 tablet (40 mg total) by mouth daily at 6 PM. 10/16/16   Demetrios Loll, MD  cephALEXin (KEFLEX) 500 MG capsule Take 1 capsule (500 mg total) by mouth 2 (two) times daily. 03/11/17   Epifanio Lesches, MD  Fluticasone-Salmeterol (ADVAIR) 100-50 MCG/DOSE AEPB Inhale 1 puff into the lungs 2 (two) times daily.    [provider]  gabapentin (NEURONTIN) 100 MG capsule Take 2 capsules (200 mg total) by mouth 2 (two) times daily. Patient not taking: Reported on 03/09/2017 10/16/16   Demetrios Loll, MD  insulin aspart (NOVOLOG) 100 UNIT/ML injection Inject 15 Units into the skin 3 (three) times daily with meals. 10/16/16   Demetrios Loll, MD  insulin glargine (LANTUS) 100 UNIT/ML injection  Inject 0.3 mLs (30 Units total) into the skin 2 (two) times daily. Patient taking differently: Inject 35 Units into the skin at bedtime.  10/16/16   Demetrios Loll, MD  lisinopril (PRINIVIL,ZESTRIL) 10 MG tablet Take 10 mg by mouth daily.    [provider]  meloxicam (MOBIC) 15 MG tablet Take 15 mg by mouth daily.    [provider]  ondansetron (ZOFRAN) 4 MG tablet Take 1 tablet (4 mg total) by mouth every 8 (eight) hours as needed for nausea or vomiting. 03/11/17   Epifanio Lesches, MD    Allergies Contrast media [iodinated diagnostic agents]  Family History  Problem Relation Age of Onset  . Hypertension Mother   . Hypertension Father   . CAD Father   . Breast cancer Neg Hx     Social History Social History  Substance Use Topics  . Smoking status: Current Every Day Smoker    Packs/day: 0.50    Types: Cigarettes  . Smokeless tobacco: Never Used  . Alcohol use 8.4 oz/week    14 Cans of beer per week     Review of Systems  Constitutional: No fever/chills Eyes: No visual changes. No discharge ENT: No upper respiratory complaints. Cardiovascular: no chest pain. Respiratory: no cough. No SOB. Gastrointestinal: No abdominal pain.  No nausea, no vomiting.  No diarrhea.  No constipation. Musculoskeletal: Patient has bilateral knee pain.  Skin: Negative for rash, abrasions, lacerations, ecchymosis. Neurological: Negative for headaches, focal weakness or numbness.   ____________________________________________  PHYSICAL EXAM:  VITAL SIGNS: ED Triage Vitals  Enc Vitals Group     BP 07/05/17 1721 (!) 142/108     Pulse Rate 07/05/17 1721 (!) 110     Resp 07/05/17 1721 18     Temp 07/05/17 1721 99 F (37.2 C)     Temp Source 07/05/17 1721 Oral     SpO2 07/05/17 1721 99 %     Weight --      Height --      Head Circumference --      Peak Flow --      Pain Score 07/05/17 1716 9     Pain Loc --      Pain Edu? --      Excl. in [place]? --       Constitutional: Alert and oriented. Well appearing and in no acute distress. Eyes: Conjunctivae are normal. PERRL. EOMI. Head: Atraumatic. Cardiovascular: Normal rate, regular rhythm. Normal S1 and S2.  Good peripheral circulation. Respiratory: Normal respiratory effort without tachypnea or retractions. Lungs CTAB. Good air entry to the bases with no decreased or absent breath sounds. Musculoskeletal: Full range of motion to all extremities. Knees bilaterally: Negative anterior and posterior drawer sign. No laxity with MCL or LCL testing. Negative apprehension test. Negative ballottement. Neurologic:  Normal speech and language. No gross focal neurologic deficits are appreciated.  Skin:  Skin is warm, dry and intact. No rash noted. Psychiatric: Mood and affect are normal. Speech and behavior are normal. Patient exhibits appropriate insight and judgement.   ____________________________________________   LABS (all labs ordered are listed, but only abnormal results are displayed)  Labs Reviewed - No data to display ____________________________________________  EKG   ____________________________________________  RADIOLOGY Unk Pinto, personally viewed and evaluated these images (plain radiographs) as part of my medical decision making, as well as reviewing the written report by the radiologist.  Dg Knee Complete 4 Views Left  Result Date: 07/05/2017 CLINICAL DATA:  [place] 3 days ago.  Anterior pain. EXAM: LEFT KNEE - COMPLETE 4+ VIEW COMPARISON:  01/19/2011 FINDINGS: Small knee joint effusion. Tricompartmental osteoarthritis most pronounced in the medial compartment followed by the patellofemoral compartment and the lateral compartment. No sign of fracture. No loose bodies are seen. IMPRESSION: Advanced osteoarthritis, most pronounced in the medial compartment. No acute/traumatic finding. Small effusion. Electronically Signed   By: Nelson Chimes M.D.   On: 07/05/2017 18:07   Dg  Knee Complete 4 Views Right  Result Date: 07/05/2017 CLINICAL DATA:  Bilateral knee pain after fall.  Initial encounter. EXAM: RIGHT KNEE - COMPLETE 4+ VIEW COMPARISON:  01/19/2011 FINDINGS: Negative for acute fracture or dislocation. Negative for joint effusion. Osteopenia. Tricompartmental osteoarthritis with advanced medial compartment narrowing with apparent bone-on-bone contact. Bulky marginal spurring that has progressed from prior. Varicose veins seen in the medial calf and thigh. IMPRESSION: 1. No acute finding. 2. Advanced tricompartmental osteoarthritis. 3. Medial varicose veins. Electronically Signed   By: Monte Fantasia M.D.   On: 07/05/2017 18:07    ____________________________________________    PROCEDURES  Procedure(s) performed:    Procedures    Medications - No data to display   ____________________________________________   INITIAL IMPRESSION / ASSESSMENT AND PLAN / ED COURSE  Pertinent labs & imaging results that were available during my care of the patient were reviewed by me and considered in my medical decision making (see chart for details).  Review of the  [place]RS was performed in accordance of the [place] prior to dispensing any  controlled drugs.    Assessment and plan: Bilateral Knee Pain.  Patient presents to the emergency department with bilateral knee pain after a fall 3 days ago. X-ray examination was unremarkable for acute fractures or bony abnormalities. Tramadol was given in the emergency department for pain. Patient was discharged with tramadol and meloxicam. Patient was advised to follow up with orthopedics, Dr. Marry Guan. All patient questions were answered.     ____________________________________________  FINAL CLINICAL IMPRESSION(S) / ED DIAGNOSES  Final diagnoses:  None      NEW MEDICATIONS STARTED DURING THIS VISIT:  New Prescriptions   No medications on file        This chart was dictated using voice recognition  software/Dragon. Despite best efforts to proofread, errors can occur which can change the meaning. Any change was purely unintentional.    Karren Cobble 07/05/17 2249    Carrie Mew, MD 07/05/17 2328

## 2017-07-05 NOTE — ED Triage Notes (Signed)
Presents with family with bilateral knee pain s/p fall on Saturday  States her pain is getting worse  Unable to bear wt. States she is scheduled to see someone at [facility] to knee surgery next month

## 2017-07-05 NOTE — ED Notes (Signed)
Patient asked if there was a sandwich tray that she and her husband could have. She offered to pay for them. I brought her two food trays from the main ER refrigerator.

## 2017-08-29 ENCOUNTER — Emergency Department
Admission: EM | Admit: 2017-08-29 | Discharge: 2017-08-29 | Disposition: A | Discharge status: Home / Self Care | Payer: Medicaid Other | Attending: Emergency Medicine | Admitting: Emergency Medicine

## 2017-08-29 ENCOUNTER — Emergency Department: Payer: Medicaid Other

## 2017-08-29 DIAGNOSIS — J45909 Unspecified asthma, uncomplicated: Secondary | ICD-10-CM | POA: Insufficient documentation

## 2017-08-29 DIAGNOSIS — Z79899 Other long term (current) drug therapy: Secondary | ICD-10-CM | POA: Diagnosis not present

## 2017-08-29 DIAGNOSIS — Z794 Long term (current) use of insulin: Secondary | ICD-10-CM | POA: Insufficient documentation

## 2017-08-29 DIAGNOSIS — E119 Type 2 diabetes mellitus without complications: Secondary | ICD-10-CM | POA: Diagnosis not present

## 2017-08-29 DIAGNOSIS — I10 Essential (primary) hypertension: Secondary | ICD-10-CM | POA: Insufficient documentation

## 2017-08-29 DIAGNOSIS — J449 Chronic obstructive pulmonary disease, unspecified: Secondary | ICD-10-CM | POA: Diagnosis not present

## 2017-08-29 DIAGNOSIS — Z7982 Long term (current) use of aspirin: Secondary | ICD-10-CM | POA: Insufficient documentation

## 2017-08-29 DIAGNOSIS — F17210 Nicotine dependence, cigarettes, uncomplicated: Secondary | ICD-10-CM | POA: Insufficient documentation

## 2017-08-29 DIAGNOSIS — K529 Noninfective gastroenteritis and colitis, unspecified: Secondary | ICD-10-CM

## 2017-08-29 DIAGNOSIS — R1084 Generalized abdominal pain: Secondary | ICD-10-CM

## 2017-08-29 LAB — COMPREHENSIVE METABOLIC PANEL
ALBUMIN: 3.7 g/dL (ref 3.5–5.0)
ALT: 17 U/L (ref 14–54)
ANION GAP: 11 (ref 5–15)
AST: 33 U/L (ref 15–41)
Alkaline Phosphatase: 81 U/L (ref 38–126)
BUN: 15 mg/dL (ref 6–20)
CO2: 24 mmol/L (ref 22–32)
Calcium: 9.1 mg/dL (ref 8.9–10.3)
Chloride: 107 mmol/L (ref 101–111)
Creatinine, Ser: 1.27 mg/dL — ABNORMAL HIGH (ref 0.44–1.00)
GFR calc Af Amer: 55 mL/min — ABNORMAL LOW (ref >60)
GFR calc non Af Amer: 47 mL/min — ABNORMAL LOW (ref >60)
GLUCOSE: 126 mg/dL — AB (ref 65–99)
Potassium: 3.6 mmol/L (ref 3.5–5.1)
Sodium: 142 mmol/L (ref 135–145)
TOTAL PROTEIN: 7.4 g/dL (ref 6.5–8.1)
Total Bilirubin: 1 mg/dL (ref 0.3–1.2)

## 2017-08-29 LAB — LIPASE, BLOOD: Lipase: 50 U/L (ref 11–51)

## 2017-08-29 LAB — CBC
HEMATOCRIT: 40.7 % (ref 35.0–47.0)
HEMOGLOBIN: 14.2 g/dL (ref 12.0–16.0)
MCH: 39.4 pg — ABNORMAL HIGH (ref 26.0–34.0)
MCHC: 34.8 g/dL (ref 32.0–36.0)
MCV: 113 fL — ABNORMAL HIGH (ref 80.0–100.0)
Platelets: 242 10*3/uL (ref 150–440)
RBC: 3.6 MIL/uL — ABNORMAL LOW (ref 3.80–5.20)
RDW: 16 % — ABNORMAL HIGH (ref 11.5–14.5)
WBC: 4.5 10*3/uL (ref 3.6–11.0)

## 2017-08-29 MED ORDER — SODIUM CHLORIDE 0.9 % IV BOLUS (SEPSIS)
1000.0000 mL | Freq: Once | INTRAVENOUS | Status: AC
  Administered 2017-08-29: 1000 mL via INTRAVENOUS

## 2017-08-29 MED ORDER — LISINOPRIL 10 MG PO TABS
10.0000 mg | ORAL_TABLET | Freq: Once | ORAL | Status: AC
  Administered 2017-08-29: 10 mg via ORAL
  Filled 2017-08-29: qty 1

## 2017-08-29 MED ORDER — NAPROXEN 500 MG PO TABS: 500.0000 mg | ORAL_TABLET | Freq: Two times a day (BID) | ORAL | 0 refills | Status: DC

## 2017-08-29 MED ORDER — ONDANSETRON HCL 4 MG/2ML IJ SOLN
4.0000 mg | Freq: Once | INTRAMUSCULAR | Status: AC
  Administered 2017-08-29: 4 mg via INTRAVENOUS
  Filled 2017-08-29: qty 2

## 2017-08-29 MED ORDER — BARIUM SULFATE 2.1 % PO SUSP: 450.0000 mL | ORAL | Status: AC

## 2017-08-29 MED ORDER — HYDROMORPHONE HCL 1 MG/ML IJ SOLN
1.0000 mg | Freq: Once | INTRAMUSCULAR | Status: AC
  Administered 2017-08-29: 1 mg via INTRAVENOUS
  Filled 2017-08-29: qty 1

## 2017-08-29 MED ORDER — ONDANSETRON 4 MG PO TBDP: 4.0000 mg | ORAL_TABLET | Freq: Three times a day (TID) | ORAL | 0 refills | Status: DC | PRN

## 2017-08-29 MED ORDER — LOPERAMIDE HCL 2 MG PO TABS: 4.0000 mg | ORAL_TABLET | Freq: Four times a day (QID) | ORAL | 0 refills | Status: DC | PRN

## 2017-08-29 NOTE — ED Notes (Signed)
Iv fluids infused  meds given.  Pt crying  States im just upset and hurt too.  md aware.  Sinus tach onmonitor.  Family with pt.

## 2017-08-29 NOTE — ED Provider Notes (Addendum)
[facility] Emergency Department Provider Note  ____________________________________________  Time seen: Approximately 2:30 PM  I have reviewed the triage vital signs and the nursing notes.   HISTORY  Chief Complaint Abdominal Pain    HPI Aimee Buck is a 56 y.o. female who complains of generalized abdominal pain starting yesterday. It's been constant, waxing and waning, sharp, nonradiating, severe. Associated nausea vomiting and diarrhea. Patient denies sick contacts or similar previous symptoms. Denies any prior abdominal surgeries. Denies fever chills or sweats. No aggravating or alleviating factors.     Past Medical History:  Diagnosis Date  . Asthma   . COPD (chronic obstructive pulmonary disease) ([place])   . Diabetes ([place])   . Hypertension      Patient Active Problem List   Diagnosis Date Noted  . Pyelonephritis 03/09/2017  . Hyperglycemia 10/14/2016     Past Surgical History:  Procedure Laterality Date  . ANKLE SURGERY Left      Prior to Admission medications   Medication Sig Start Date End Date Taking? Authorizing Provider  aspirin EC 81 MG tablet Take 81 mg by mouth daily.   Yes [provider]  atorvastatin (LIPITOR) 40 MG tablet Take 1 tablet (40 mg total) by mouth daily at 6 PM. 10/16/16  Yes Demetrios Loll, MD  Fluticasone-Salmeterol (ADVAIR) 100-50 MCG/DOSE AEPB Inhale 1 puff into the lungs 2 (two) times daily.   Yes [provider]  gabapentin (NEURONTIN) 100 MG capsule Take 2 capsules (200 mg total) by mouth 2 (two) times daily. 10/16/16  Yes Demetrios Loll, MD  insulin aspart (NOVOLOG) 100 UNIT/ML injection Inject 15 Units into the skin 3 (three) times daily with meals. 10/16/16  Yes Demetrios Loll, MD  insulin glargine (LANTUS) 100 UNIT/ML injection Inject 0.3 mLs (30 Units total) into the skin 2 (two) times daily. Patient taking differently: Inject 35 Units into the skin at bedtime.  10/16/16  Yes Demetrios Loll,  MD  lisinopril (PRINIVIL,ZESTRIL) 10 MG tablet Take 10 mg by mouth daily.   Yes [provider]  omeprazole (PRILOSEC) 40 MG capsule Take 40 mg by mouth daily. 10/28/16  Yes [provider]  traMADol (ULTRAM) 50 MG tablet Take 50 mg by mouth every 6 (six) hours as needed.   Yes [provider]  loperamide (IMODIUM A-D) 2 MG tablet Take 2 tablets (4 mg total) by mouth 4 (four) times daily as needed for diarrhea or loose stools. 08/29/17   Carrie Mew, MD  naproxen (NAPROSYN) 500 MG tablet Take 1 tablet (500 mg total) by mouth 2 (two) times daily with a meal. 08/29/17   Carrie Mew, MD  ondansetron (ZOFRAN ODT) 4 MG disintegrating tablet Take 1 tablet (4 mg total) by mouth every 8 (eight) hours as needed for nausea or vomiting. 08/29/17   Carrie Mew, MD  ondansetron (ZOFRAN) 4 MG tablet Take 1 tablet (4 mg total) by mouth every 8 (eight) hours as needed for nausea or vomiting. Patient not taking: Reported on 08/29/2017 03/11/17   Epifanio Lesches, MD     Allergies Contrast media [iodinated diagnostic agents]   Family History  Problem Relation Age of Onset  . Hypertension Mother   . Hypertension Father   . CAD Father   . Breast cancer Neg Hx     Social History Social History  Substance Use Topics  . Smoking status: Current Every Day Smoker    Packs/day: 0.50    Types: Cigarettes  . Smokeless tobacco: Never Used  . Alcohol  use 8.4 oz/week    14 Cans of beer per week    Review of Systems  Constitutional:   No fever or chills.  ENT:   No sore throat. No rhinorrhea. Cardiovascular:   No chest pain or syncope. Respiratory:   No dyspnea or cough. Gastrointestinal:  positive as above for abdominal pain with vomiting and diarrhea.  Musculoskeletal:   Negative for focal pain or swelling All other systems reviewed and are negative except as documented above in ROS and HPI.  ____________________________________________   PHYSICAL  EXAM:  VITAL SIGNS: ED Triage Vitals  Enc Vitals Group     BP 08/29/17 1311 (!) 157/132     Pulse Rate 08/29/17 1311 (!) 121     Resp 08/29/17 1311 (!) 32     Temp 08/29/17 1323 97.9 F (36.6 C)     Temp Source 08/29/17 1323 Oral     SpO2 08/29/17 1311 97 %     Weight --      Height --      Head Circumference --      Peak Flow --      Pain Score 08/29/17 1300 10     Pain Loc --      Pain Edu? --      Excl. in [place]? --     Vital signs reviewed, nursing assessments reviewed.   Constitutional:   Alert and oriented. very uncomfortable but not in distresss. Eyes:   No scleral icterus.  EOMI. No nystagmus. No conjunctival pallor. PERRL. ENT   Head:   Normocephalic and atraumatic.   Nose:   No congestion/rhinnorhea.    Mouth/Throat:   MMM, no pharyngeal erythema. No peritonsillar mass.    Neck:   No meningismus. Full ROM Hematological/Lymphatic/Immunilogical:   No cervical lymphadenopathy. Cardiovascular:   RRR. Symmetric bilateral radial and DP pulses.  No murmurs.  Respiratory:   Normal respiratory effort without tachypnea/retractions. Breath sounds are clear and equal bilaterally. No wheezes/rales/rhonchi. Gastrointestinal:   Soft with right lower quadrant tenderness. Non distended.  No rebound, rigidity, or guarding. Genitourinary:   deferred Musculoskeletal:   Normal range of motion in all extremities. No joint effusions.  No lower extremity tenderness.  No edema. Neurologic:   Normal speech and language.  Motor grossly intact. No gross focal neurologic deficits are appreciated.  Skin:    Skin is warm, dry and intact. No rash noted.  No petechiae, purpura, or bullae.  ____________________________________________    LABS (pertinent positives/negatives) (all labs ordered are listed, but only abnormal results are displayed) Labs Reviewed  COMPREHENSIVE METABOLIC PANEL - Abnormal; Notable for the following:       Result Value   Glucose, Bld 126 (*)     Creatinine, Ser 1.27 (*)    GFR calc non Af Amer 47 (*)    GFR calc Af Amer 55 (*)    All other components within normal limits  CBC - Abnormal; Notable for the following:    RBC 3.60 (*)    MCV 113.0 (*)    MCH 39.4 (*)    RDW 16.0 (*)    All other components within normal limits  LIPASE, BLOOD  URINALYSIS, COMPLETE (UACMP) WITH MICROSCOPIC   ____________________________________________   EKG    ____________________________________________    RADIOLOGY  Ct Abdomen Pelvis Wo Contrast  Result Date: 08/29/2017 CLINICAL DATA:  Vomiting, diarrhea. EXAM: CT ABDOMEN AND PELVIS WITHOUT CONTRAST TECHNIQUE: Multidetector CT imaging of the abdomen and pelvis was performed following the standard protocol  without IV contrast. COMPARISON:  CT scan of March 09, 2017. FINDINGS: Lower chest: No acute abnormality. Hepatobiliary: No focal liver abnormality is seen. No gallstones, gallbladder wall thickening, or biliary dilatation. Pancreas: Unremarkable. No pancreatic ductal dilatation or surrounding inflammatory changes. Spleen: Normal in size without focal abnormality. Adrenals/Urinary Tract: Adrenal glands are unremarkable. Kidneys are normal, without renal calculi, focal lesion, or hydronephrosis. Bladder is unremarkable. Stomach/Bowel: Stomach is within normal limits. Appendix appears normal. No evidence of bowel wall thickening, distention, or inflammatory changes. Small sliding-type hiatal hernia is noted. Vascular/Lymphatic: Aortic atherosclerosis. No enlarged abdominal or pelvic lymph nodes. Reproductive: Uterus and bilateral adnexa are unremarkable. Other: No abdominal wall hernia or abnormality. No abdominopelvic ascites. 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 IMPRESSION: Aortic atherosclerosis. Small sliding-type hiatal hernia. No acute abnormality seen in the abdomen or pelvis.  Electronically Signed   By: Marijo Conception, M.D.   On: 08/29/2017 15:30    ____________________________________________   PROCEDURES Procedures  ____________________________________________   INITIAL IMPRESSION / ASSESSMENT AND PLAN / ED COURSE  Pertinent labs & imaging results that were available during my care of the patient were reviewed by me and considered in my medical decision making (see chart for details).  patient presents with severe abdominal pain. Vital signs reveal tachycardia, pain related versus stress reaction. Get a CT scan of the abdomen and pelvis given the patient's severe symptoms, comorbidities including diabetes and obesity. IV fluids for hydration, Zofran and Dilaudid for symptom relief.  Clinical Course as of Aug 30 1643  Mon Aug 29, 2017  1535 CT negative. Pt reports she hasn't taken her BP med in 2 days. Will po trial and give her lisionpril.   [PS]    Clinical Course User Index [PS] Carrie Mew, MD     ----------------------------------------- 4:43 PM on 08/29/2017 -----------------------------------------  Patient updated on her results. She reports that she feels symptoms are manageable and just wants to go home and get in her bed and go to sleep. Return precautions discussed, very likely viral syndrome with gastroenteritis, though she has any worsening of symptoms or persistent vomiting and inability to tolerate oral intake, she'll return to ED.Considering the patient's symptoms, medical history, and physical examination today, I have low suspicion for cholecystitis or biliary pathology, pancreatitis, perforation or bowel obstruction, hernia, intra-abdominal abscess, AAA or dissection, volvulus or intussusception, mesenteric ischemia, or appendicitis.  Low suspicion of torsion PID.  ____________________________________________   FINAL CLINICAL IMPRESSION(S) / ED DIAGNOSES  Final diagnoses:  Generalized abdominal pain  Acute  gastroenteritis      New Prescriptions   LOPERAMIDE (IMODIUM A-D) 2 MG TABLET    Take 2 tablets (4 mg total) by mouth 4 (four) times daily as needed for diarrhea or loose stools.   NAPROXEN (NAPROSYN) 500 MG TABLET    Take 1 tablet (500 mg total) by mouth 2 (two) times daily with a meal.   ONDANSETRON (ZOFRAN ODT) 4 MG DISINTEGRATING TABLET    Take 1 tablet (4 mg total) by mouth every 8 (eight) hours as needed for nausea or vomiting.     Portions of this note were generated with dragon dictation software. Dictation errors may occur despite best attempts at proofreading.    Carrie Mew, MD 08/29/17 [place]    Carrie Mew, MD 08/29/17 [phone]

## 2017-08-29 NOTE — ED Notes (Signed)
Resumed care from [place] rn.  Pt in ct scan.

## 2017-08-29 NOTE — ED Notes (Signed)
Pt informed by CT to start drinking first bottle of barium at 1:30, drink slowly. Ideally to start 2nd bottle at 2:30. Pt informed that if she can't keep it down to let this RN know.

## 2017-08-29 NOTE — ED Notes (Signed)
Pt eating icechips    No bp meds for 2 days  md aware.

## 2017-08-29 NOTE — ED Triage Notes (Signed)
Pt c/o generalized abd pain with N/V/D since yesterday. Pt is rocking and crying in triage.. States she lost control of her bowels PTA due to diarrhea.Aimee Buck

## 2017-08-29 NOTE — ED Notes (Signed)
Pt finished one bottle of contrast and states she can't drink anymore. Dr. Joni Fears made aware and stated it was ok for pt to go to CT. CT notified.

## 2017-08-29 NOTE — ED Notes (Signed)
Pt states she ate at a fast food restaurant last night, about 3 hours later began vomiting and having liquid diarrhea. States yellow in color. Denies blood in vomit or stool. Pt is fidgeting around on stretcher, keeps saying "my stomach hurts." clutching upper middle abdomen. States she still has all abdominal organs. 1 episode vomit while assessing. Family at bedside. Alert, oriented, ambulatory.

## 2017-08-30 ENCOUNTER — Encounter: Payer: Self-pay | Admitting: *Deleted

## 2017-08-30 ENCOUNTER — Emergency Department
Admission: EM | Admit: 2017-08-30 | Discharge: 2017-08-30 | Disposition: A | Discharge status: Home / Self Care | Payer: Medicaid Other | Attending: Emergency Medicine | Admitting: Emergency Medicine

## 2017-08-30 DIAGNOSIS — R1084 Generalized abdominal pain: Secondary | ICD-10-CM | POA: Insufficient documentation

## 2017-08-30 DIAGNOSIS — I10 Essential (primary) hypertension: Secondary | ICD-10-CM | POA: Diagnosis not present

## 2017-08-30 DIAGNOSIS — R112 Nausea with vomiting, unspecified: Secondary | ICD-10-CM | POA: Insufficient documentation

## 2017-08-30 DIAGNOSIS — J45909 Unspecified asthma, uncomplicated: Secondary | ICD-10-CM | POA: Diagnosis not present

## 2017-08-30 DIAGNOSIS — Z794 Long term (current) use of insulin: Secondary | ICD-10-CM | POA: Insufficient documentation

## 2017-08-30 DIAGNOSIS — Z79899 Other long term (current) drug therapy: Secondary | ICD-10-CM | POA: Diagnosis not present

## 2017-08-30 DIAGNOSIS — Z7982 Long term (current) use of aspirin: Secondary | ICD-10-CM | POA: Insufficient documentation

## 2017-08-30 DIAGNOSIS — J449 Chronic obstructive pulmonary disease, unspecified: Secondary | ICD-10-CM | POA: Diagnosis not present

## 2017-08-30 DIAGNOSIS — F17210 Nicotine dependence, cigarettes, uncomplicated: Secondary | ICD-10-CM | POA: Diagnosis not present

## 2017-08-30 DIAGNOSIS — E119 Type 2 diabetes mellitus without complications: Secondary | ICD-10-CM | POA: Insufficient documentation

## 2017-08-30 DIAGNOSIS — R197 Diarrhea, unspecified: Secondary | ICD-10-CM | POA: Insufficient documentation

## 2017-08-30 LAB — COMPREHENSIVE METABOLIC PANEL
ALBUMIN: 3.5 g/dL (ref 3.5–5.0)
ALT: 16 U/L (ref 14–54)
ANION GAP: 11 (ref 5–15)
AST: 27 U/L (ref 15–41)
Alkaline Phosphatase: 91 U/L (ref 38–126)
BUN: 11 mg/dL (ref 6–20)
CHLORIDE: 102 mmol/L (ref 101–111)
CO2: 24 mmol/L (ref 22–32)
Calcium: 8.7 mg/dL — ABNORMAL LOW (ref 8.9–10.3)
Creatinine, Ser: 1.06 mg/dL — ABNORMAL HIGH (ref 0.44–1.00)
GFR calc non Af Amer: 59 mL/min — ABNORMAL LOW (ref >60)
GLUCOSE: 111 mg/dL — AB (ref 65–99)
POTASSIUM: 3.5 mmol/L (ref 3.5–5.1)
Sodium: 137 mmol/L (ref 135–145)
Total Bilirubin: 1.4 mg/dL — ABNORMAL HIGH (ref 0.3–1.2)
Total Protein: 7.6 g/dL (ref 6.5–8.1)

## 2017-08-30 LAB — LIPASE, BLOOD: Lipase: 60 U/L — ABNORMAL HIGH (ref 11–51)

## 2017-08-30 LAB — CBC
HCT: 47.1 % — ABNORMAL HIGH (ref 35.0–47.0)
Hemoglobin: 16 g/dL (ref 12.0–16.0)
MCH: 38.9 pg — ABNORMAL HIGH (ref 26.0–34.0)
MCHC: 33.9 g/dL (ref 32.0–36.0)
MCV: 114.5 fL — AB (ref 80.0–100.0)
PLATELETS: 218 10*3/uL (ref 150–440)
RBC: 4.11 MIL/uL (ref 3.80–5.20)
RDW: 15.8 % — AB (ref 11.5–14.5)
WBC: 4.4 10*3/uL (ref 3.6–11.0)

## 2017-08-30 LAB — GLUCOSE, CAPILLARY: GLUCOSE-CAPILLARY: 163 mg/dL — AB (ref 65–99)

## 2017-08-30 MED ORDER — ONDANSETRON 4 MG PO TBDP: 4.0000 mg | ORAL_TABLET | Freq: Three times a day (TID) | ORAL | 0 refills | Status: DC | PRN

## 2017-08-30 MED ORDER — NAPROXEN 500 MG PO TABS: 500.0000 mg | ORAL_TABLET | Freq: Two times a day (BID) | ORAL | 0 refills | Status: DC

## 2017-08-30 MED ORDER — KETOROLAC TROMETHAMINE 30 MG/ML IJ SOLN
30.0000 mg | Freq: Once | INTRAMUSCULAR | Status: AC
  Administered 2017-08-30: 30 mg via INTRAVENOUS
  Filled 2017-08-30: qty 1

## 2017-08-30 MED ORDER — DEXTROSE 5 % AND 0.9 % NACL IV BOLUS
1000.0000 mL | Freq: Once | INTRAVENOUS | Status: AC
  Administered 2017-08-30: 1000 mL via INTRAVENOUS

## 2017-08-30 MED ORDER — LOPERAMIDE HCL 2 MG PO TABS: 4.0000 mg | ORAL_TABLET | Freq: Four times a day (QID) | ORAL | 0 refills | Status: DC | PRN

## 2017-08-30 MED ORDER — SODIUM CHLORIDE 0.9 % IV BOLUS (SEPSIS)
1000.0000 mL | Freq: Once | INTRAVENOUS | Status: AC
  Administered 2017-08-30: 1000 mL via INTRAVENOUS

## 2017-08-30 MED ORDER — DICYCLOMINE HCL 10 MG/ML IM SOLN
20.0000 mg | Freq: Once | INTRAMUSCULAR | Status: AC
  Administered 2017-08-30: 20 mg via INTRAMUSCULAR
  Filled 2017-08-30: qty 2

## 2017-08-30 MED ORDER — ONDANSETRON HCL 4 MG/2ML IJ SOLN
4.0000 mg | Freq: Once | INTRAMUSCULAR | Status: AC
  Administered 2017-08-30: 4 mg via INTRAVENOUS
  Filled 2017-08-30: qty 2

## 2017-08-30 MED ORDER — ONDANSETRON HCL 4 MG/2ML IJ SOLN
4.0000 mg | Freq: Once | INTRAMUSCULAR | Status: AC | PRN
  Administered 2017-08-30: 4 mg via INTRAVENOUS
  Filled 2017-08-30: qty 2

## 2017-08-30 NOTE — Discharge Instructions (Signed)
Your blood tests today were unremarkable again.  You do appear to be dehydrated from the diarrhea.  Use the nausea medicine and drink lots of fluids to stay hydrated until your symptoms improve.  Return to the ER right away if your symptoms worsen in any way.
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
## 2017-08-30 NOTE — ED Triage Notes (Signed)
PT to ED from home reporting continued lower abd pain with NVD for the past 2 days. PT was seen in ED yesterday and dx with viral gastroenteritis but reports she was unable to get medications filled and unable to control symptoms at home. Pt reprots nothing about her pain has changed from yesterday to today.

## 2017-08-30 NOTE — ED Notes (Signed)
Patient states she is ready to leave. Patient has to be at the pharmacy by 5pm to get her scripts. EDP aware.

## 2017-08-30 NOTE — ED Notes (Signed)
Patient verbalizes understanding of d/c instructions and follow-up. VS stable and pain controlled per patient.  Patient in NAD at time of d/c and denies further concerns regarding this visit. Patient stable at the time of departure from the unit, departing unit by the safest and most appropriate manner per that patients condition and limitations. Patient advised to return to the ED at any time for emergent concerns, or for new/worsening symptoms.   

## 2017-08-30 NOTE — ED Notes (Signed)
RN spoke with Dr. Corky Downs to check if protocols needed to be redone. MD verbalized to draw blood and fluids may bee needed throughout treatment. IV placed for blood draw.

## 2018-03-04 ENCOUNTER — Inpatient Hospital Stay
Admission: EM | Admit: 2018-03-04 | Discharge: 2018-03-06 | DRG: 871 | Discharge status: Against Medical Advice | Payer: Medicaid Other | Attending: Internal Medicine | Admitting: Internal Medicine

## 2018-03-04 ENCOUNTER — Encounter: Payer: Self-pay | Admitting: Emergency Medicine

## 2018-03-04 DIAGNOSIS — F32A Depression, unspecified: Secondary | ICD-10-CM

## 2018-03-04 DIAGNOSIS — E876 Hypokalemia: Secondary | ICD-10-CM | POA: Diagnosis present

## 2018-03-04 DIAGNOSIS — E119 Type 2 diabetes mellitus without complications: Secondary | ICD-10-CM | POA: Diagnosis present

## 2018-03-04 DIAGNOSIS — J690 Pneumonitis due to inhalation of food and vomit: Secondary | ICD-10-CM | POA: Diagnosis present

## 2018-03-04 DIAGNOSIS — F17210 Nicotine dependence, cigarettes, uncomplicated: Secondary | ICD-10-CM | POA: Diagnosis present

## 2018-03-04 DIAGNOSIS — Z794 Long term (current) use of insulin: Secondary | ICD-10-CM

## 2018-03-04 DIAGNOSIS — A419 Sepsis, unspecified organism: Principal | ICD-10-CM | POA: Diagnosis present

## 2018-03-04 DIAGNOSIS — G92 Toxic encephalopathy: Secondary | ICD-10-CM | POA: Diagnosis present

## 2018-03-04 DIAGNOSIS — Z7982 Long term (current) use of aspirin: Secondary | ICD-10-CM

## 2018-03-04 DIAGNOSIS — F329 Major depressive disorder, single episode, unspecified: Secondary | ICD-10-CM | POA: Diagnosis present

## 2018-03-04 DIAGNOSIS — R45851 Suicidal ideations: Secondary | ICD-10-CM | POA: Diagnosis present

## 2018-03-04 DIAGNOSIS — J189 Pneumonia, unspecified organism: Secondary | ICD-10-CM | POA: Diagnosis present

## 2018-03-04 DIAGNOSIS — E785 Hyperlipidemia, unspecified: Secondary | ICD-10-CM | POA: Diagnosis present

## 2018-03-04 DIAGNOSIS — F419 Anxiety disorder, unspecified: Secondary | ICD-10-CM | POA: Diagnosis present

## 2018-03-04 DIAGNOSIS — Y908 Blood alcohol level of 240 mg/100 ml or more: Secondary | ICD-10-CM | POA: Diagnosis present

## 2018-03-04 DIAGNOSIS — F10129 Alcohol abuse with intoxication, unspecified: Secondary | ICD-10-CM | POA: Diagnosis present

## 2018-03-04 DIAGNOSIS — J449 Chronic obstructive pulmonary disease, unspecified: Secondary | ICD-10-CM | POA: Diagnosis present

## 2018-03-04 DIAGNOSIS — Z79899 Other long term (current) drug therapy: Secondary | ICD-10-CM

## 2018-03-04 DIAGNOSIS — F1010 Alcohol abuse, uncomplicated: Secondary | ICD-10-CM

## 2018-03-04 DIAGNOSIS — I10 Essential (primary) hypertension: Secondary | ICD-10-CM | POA: Diagnosis present

## 2018-03-04 LAB — COMPREHENSIVE METABOLIC PANEL
ALK PHOS: 106 U/L (ref 38–126)
ALT: 16 U/L (ref 14–54)
AST: 28 U/L (ref 15–41)
Albumin: 3.4 g/dL — ABNORMAL LOW (ref 3.5–5.0)
Anion gap: 10 (ref 5–15)
BUN: 8 mg/dL (ref 6–20)
CALCIUM: 8.7 mg/dL — AB (ref 8.9–10.3)
CO2: 26 mmol/L (ref 22–32)
CREATININE: 1.13 mg/dL — AB (ref 0.44–1.00)
Chloride: 108 mmol/L (ref 101–111)
GFR, EST NON AFRICAN AMERICAN: 54 mL/min — AB (ref >60)
Glucose, Bld: 112 mg/dL — ABNORMAL HIGH (ref 65–99)
Potassium: 2.8 mmol/L — ABNORMAL LOW (ref 3.5–5.1)
Sodium: 144 mmol/L (ref 135–145)
Total Bilirubin: 0.5 mg/dL (ref 0.3–1.2)
Total Protein: 7.1 g/dL (ref 6.5–8.1)

## 2018-03-04 LAB — CBC
HCT: 39.3 % (ref 35.0–47.0)
HEMOGLOBIN: 13.2 g/dL (ref 12.0–16.0)
MCH: 38.4 pg — AB (ref 26.0–34.0)
MCHC: 33.6 g/dL (ref 32.0–36.0)
MCV: 114.4 fL — AB (ref 80.0–100.0)
PLATELETS: 273 10*3/uL (ref 150–440)
RBC: 3.44 MIL/uL — AB (ref 3.80–5.20)
RDW: 16.5 % — ABNORMAL HIGH (ref 11.5–14.5)
WBC: 4 10*3/uL (ref 3.6–11.0)

## 2018-03-04 LAB — ETHANOL: ALCOHOL ETHYL (B): 344 mg/dL — AB (ref <10)

## 2018-03-04 LAB — SALICYLATE LEVEL

## 2018-03-04 LAB — ACETAMINOPHEN LEVEL: Acetaminophen (Tylenol), Serum: <10 ug/mL — ABNORMAL LOW (ref 10–30)

## 2018-03-04 MED ORDER — HALOPERIDOL 2 MG PO TABS: 2.0000 mg | ORAL_TABLET | Freq: Once | ORAL | Status: DC

## 2018-03-04 MED ORDER — HALOPERIDOL 5 MG PO TABS
10.0000 mg | ORAL_TABLET | Freq: Once | ORAL | Status: AC
  Administered 2018-03-04: 10 mg via ORAL

## 2018-03-04 MED ORDER — LORAZEPAM 1 MG PO TABS
ORAL_TABLET | ORAL | Status: AC
  Administered 2018-03-04: 2 mg via ORAL
  Filled 2018-03-04: qty 2

## 2018-03-04 MED ORDER — LORAZEPAM 2 MG PO TABS
2.0000 mg | ORAL_TABLET | Freq: Once | ORAL | Status: AC
  Administered 2018-03-04: 2 mg via ORAL

## 2018-03-04 MED ORDER — ZIPRASIDONE MESYLATE 20 MG IM SOLR
20.0000 mg | Freq: Once | INTRAMUSCULAR | Status: AC
  Administered 2018-03-04: 20 mg via INTRAMUSCULAR
  Filled 2018-03-04: qty 20

## 2018-03-04 MED ORDER — HALOPERIDOL 5 MG PO TABS
ORAL_TABLET | ORAL | Status: AC
  Administered 2018-03-04: 10 mg via ORAL
  Filled 2018-03-04: qty 2

## 2018-03-04 MED ORDER — POTASSIUM CHLORIDE CRYS ER 20 MEQ PO TBCR
40.0000 meq | EXTENDED_RELEASE_TABLET | Freq: Once | ORAL | Status: DC
  Filled 2018-03-04 (×2): qty 2

## 2018-03-04 NOTE — ED Provider Notes (Signed)
[facility] Emergency Department Provider Note   ____________________________________________   I have reviewed the triage vital signs and the nursing notes.   HISTORY  Chief Complaint Psychiatric Evaluation   History limited by and level 5 caveat due to: Intoxication   HPI Aimee Buck is a 57 y.o. female who presents to the emergency department today under IVC with police because of concern for suicidal ideation. Patient states that she has been under a lot of stress recently. Has made an appointment with a mental health provider for the upcoming week however apparently today has been drinking and has been expressing suicidal ideation.    Per medical record review patient has a history of DM, HTN, COPD.  Past Medical History:  Diagnosis Date  . Asthma   . COPD (chronic obstructive pulmonary disease) ([place])   . Diabetes ([place])   . Hypertension     Patient Active Problem List   Diagnosis Date Noted  . Pyelonephritis 03/09/2017  . Hyperglycemia 10/14/2016    Past Surgical History:  Procedure Laterality Date  . ANKLE SURGERY Left     Prior to Admission medications   Medication Sig Start Date End Date Taking? Authorizing Provider  aspirin EC 81 MG tablet Take 81 mg by mouth daily.    [provider]  atorvastatin (LIPITOR) 40 MG tablet Take 1 tablet (40 mg total) by mouth daily at 6 PM. 10/16/16   Demetrios Loll, MD  Fluticasone-Salmeterol (ADVAIR) 100-50 MCG/DOSE AEPB Inhale 1 puff into the lungs 2 (two) times daily.    [provider]  gabapentin (NEURONTIN) 100 MG capsule Take 2 capsules (200 mg total) by mouth 2 (two) times daily. 10/16/16   Demetrios Loll, MD  insulin aspart (NOVOLOG) 100 UNIT/ML injection Inject 15 Units into the skin 3 (three) times daily with meals. 10/16/16   Demetrios Loll, MD  insulin glargine (LANTUS) 100 UNIT/ML injection Inject 0.3 mLs (30 Units total) into the skin 2 (two) times daily. Patient taking  differently: Inject 35 Units into the skin at bedtime.  10/16/16   Demetrios Loll, MD  lisinopril (PRINIVIL,ZESTRIL) 10 MG tablet Take 10 mg by mouth daily.    [provider]  loperamide (IMODIUM A-D) 2 MG tablet Take 2 tablets (4 mg total) by mouth 4 (four) times daily as needed for diarrhea or loose stools. 08/30/17   Carrie Mew, MD  naproxen (NAPROSYN) 500 MG tablet Take 1 tablet (500 mg total) by mouth 2 (two) times daily with a meal. 08/30/17   Carrie Mew, MD  omeprazole (PRILOSEC) 40 MG capsule Take 40 mg by mouth daily. 10/28/16   [provider]  ondansetron (ZOFRAN ODT) 4 MG disintegrating tablet Take 1 tablet (4 mg total) by mouth every 8 (eight) hours as needed for nausea or vomiting. 08/30/17   Carrie Mew, MD  ondansetron (ZOFRAN) 4 MG tablet Take 1 tablet (4 mg total) by mouth every 8 (eight) hours as needed for nausea or vomiting. Patient not taking: Reported on 08/29/2017 03/11/17   Epifanio Lesches, MD  traMADol (ULTRAM) 50 MG tablet Take 50 mg by mouth every 6 (six) hours as needed.    [provider]    Allergies Contrast media [iodinated diagnostic agents]  Family History  Problem Relation Age of Onset  . Hypertension Mother   . Hypertension Father   . CAD Father   . Breast cancer Neg Hx     Social History Social History   Tobacco Use  . Smoking status:  Current Every Day Smoker    Packs/day: 0.50    Types: Cigarettes  . Smokeless tobacco: Never Used  Substance Use Topics  . Alcohol use: Yes    Alcohol/week: 8.4 oz    Types: 14 Cans of beer per week  . Drug use: No    Review of Systems Constitutional: No fever/chills Eyes: No visual changes. ENT: No sore throat. Cardiovascular: Denies chest pain. Respiratory: Denies shortness of breath. Gastrointestinal: No abdominal pain.  No nausea, no vomiting.  No diarrhea.   Genitourinary: Negative for dysuria. Musculoskeletal: Positive for left knee pain. Skin: Negative  for rash. Neurological: Negative for headaches, focal weakness or numbness.  ____________________________________________   PHYSICAL EXAM:  VITAL SIGNS: ED Triage Vitals  Enc Vitals Group     BP 03/04/18 1540 120/85     Pulse Rate 03/04/18 1540 79     Resp 03/04/18 1540 16     Temp 03/04/18 1540 98.9 F (37.2 C)     Temp Source 03/04/18 1540 Oral     SpO2 03/04/18 1540 97 %     Weight 03/04/18 1541 196 lb (88.9 kg)     Height 03/04/18 1541 5\' 5"  (1.651 m)     Head Circumference --      Peak Flow --      Pain Score 03/04/18 1541 0   Constitutional: Agitated. Yelling.  Eyes: Conjunctivae are normal.  ENT   Head: Normocephalic and atraumatic.   Nose: No congestion/rhinnorhea.   Mouth/Throat: Mucous membranes are moist.   Neck: No stridor. Hematological/Lymphatic/Immunilogical: No cervical lymphadenopathy. Cardiovascular: Normal rate, regular rhythm.  No murmurs, rubs, or gallops.  Respiratory: Normal respiratory effort without tachypnea nor retractions. Breath sounds are clear and equal bilaterally. No wheezes/rales/rhonchi. Gastrointestinal: Soft and non tender. No rebound. No guarding.  Genitourinary: Deferred Musculoskeletal: Normal range of motion in all extremities. No lower extremity edema. Neurologic:  Appears intoxicated. Yelling. Moving all extremities.  Skin:  Skin is warm, dry and intact. No rash noted. Psychiatric: Slightly labile. Agitated, tearful, yelling.  ____________________________________________    LABS (pertinent positives/negatives)  Ethanol 344 Acetaminophen, salicylate negative CBC wbc 4.0, hgb 13.2, plt 273 CMP k 2.8, glu 112, cr 1.13, ca 8.7  ____________________________________________   EKG  None  ____________________________________________    RADIOLOGY  None  ____________________________________________   PROCEDURES  Procedures  ____________________________________________   INITIAL IMPRESSION /  ASSESSMENT AND PLAN / ED COURSE  Pertinent labs & imaging results that were available during my care of the patient were reviewed by me and considered in my medical decision making (see chart for details).  Patient presented to the emergency department today because of concerns for suicidal ideation and under IVC.  On exam patient is quite agitated.  She does appear intoxicated.  Given level of agitation that she is feeling I did have concerns for either self-harm or harming of staff.  Patient was given medications to help calm her down.  Will have psychiatry evaluate.   ____________________________________________   FINAL CLINICAL IMPRESSION(S) / ED DIAGNOSES  Final diagnoses:  Alcohol abuse     Note: This dictation was prepared with Dragon dictation. Any transcriptional errors that result from this process are unintentional     Nance Pear, MD 03/04/18 [phone]

## 2018-03-04 NOTE — ED Notes (Signed)
Pt. Sleeping at this time.

## 2018-03-04 NOTE — ED Notes (Signed)
Pt administered IM Geodon. Pt was agreeable to medication after speaking with nurses. Pt denies SI/HI and AVH. Pt states she was drinking today, but isn't sure why the police brought her to the hospital. Maintained on 15 minute checks and observation by security camera for safety.

## 2018-03-04 NOTE — ED Triage Notes (Signed)
Patient presents to the ED with [facility].  Patient states, "I'm tired and stressed out."  Patient reports she has been drinking alcohol.  Police report patient told them that she was having thoughts of hurting herself and didn't want to live anymore.  Patient denies at this time.  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

## 2018-03-05 ENCOUNTER — Encounter: Payer: Self-pay | Admitting: Internal Medicine

## 2018-03-05 ENCOUNTER — Emergency Department: Payer: Medicaid Other

## 2018-03-05 DIAGNOSIS — F329 Major depressive disorder, single episode, unspecified: Secondary | ICD-10-CM | POA: Diagnosis not present

## 2018-03-05 DIAGNOSIS — R45851 Suicidal ideations: Secondary | ICD-10-CM | POA: Diagnosis present

## 2018-03-05 DIAGNOSIS — J189 Pneumonia, unspecified organism: Secondary | ICD-10-CM | POA: Diagnosis present

## 2018-03-05 DIAGNOSIS — F17210 Nicotine dependence, cigarettes, uncomplicated: Secondary | ICD-10-CM | POA: Diagnosis present

## 2018-03-05 DIAGNOSIS — F419 Anxiety disorder, unspecified: Secondary | ICD-10-CM | POA: Diagnosis present

## 2018-03-05 DIAGNOSIS — F10129 Alcohol abuse with intoxication, unspecified: Secondary | ICD-10-CM | POA: Diagnosis present

## 2018-03-05 DIAGNOSIS — Z7982 Long term (current) use of aspirin: Secondary | ICD-10-CM | POA: Diagnosis not present

## 2018-03-05 DIAGNOSIS — E876 Hypokalemia: Secondary | ICD-10-CM | POA: Diagnosis present

## 2018-03-05 DIAGNOSIS — F1010 Alcohol abuse, uncomplicated: Secondary | ICD-10-CM

## 2018-03-05 DIAGNOSIS — Y908 Blood alcohol level of 240 mg/100 ml or more: Secondary | ICD-10-CM | POA: Diagnosis present

## 2018-03-05 DIAGNOSIS — Z794 Long term (current) use of insulin: Secondary | ICD-10-CM | POA: Diagnosis not present

## 2018-03-05 DIAGNOSIS — F32A Depression, unspecified: Secondary | ICD-10-CM

## 2018-03-05 DIAGNOSIS — A419 Sepsis, unspecified organism: Secondary | ICD-10-CM | POA: Diagnosis not present

## 2018-03-05 DIAGNOSIS — E119 Type 2 diabetes mellitus without complications: Secondary | ICD-10-CM | POA: Diagnosis present

## 2018-03-05 DIAGNOSIS — E785 Hyperlipidemia, unspecified: Secondary | ICD-10-CM | POA: Diagnosis present

## 2018-03-05 DIAGNOSIS — J449 Chronic obstructive pulmonary disease, unspecified: Secondary | ICD-10-CM | POA: Diagnosis present

## 2018-03-05 DIAGNOSIS — J690 Pneumonitis due to inhalation of food and vomit: Secondary | ICD-10-CM | POA: Diagnosis present

## 2018-03-05 DIAGNOSIS — I10 Essential (primary) hypertension: Secondary | ICD-10-CM | POA: Diagnosis present

## 2018-03-05 DIAGNOSIS — G92 Toxic encephalopathy: Secondary | ICD-10-CM | POA: Diagnosis present

## 2018-03-05 DIAGNOSIS — Z79899 Other long term (current) drug therapy: Secondary | ICD-10-CM | POA: Diagnosis not present

## 2018-03-05 LAB — BASIC METABOLIC PANEL
ANION GAP: 9 (ref 5–15)
BUN: 11 mg/dL (ref 6–20)
CALCIUM: 7.7 mg/dL — AB (ref 8.9–10.3)
CO2: 23 mmol/L (ref 22–32)
CREATININE: 1.28 mg/dL — AB (ref 0.44–1.00)
Chloride: 110 mmol/L (ref 101–111)
GFR calc non Af Amer: 47 mL/min — ABNORMAL LOW (ref >60)
GFR, EST AFRICAN AMERICAN: 54 mL/min — AB (ref >60)
Glucose, Bld: 116 mg/dL — ABNORMAL HIGH (ref 65–99)
Potassium: 2.7 mmol/L — CL (ref 3.5–5.1)
SODIUM: 142 mmol/L (ref 135–145)

## 2018-03-05 LAB — CBC WITH DIFFERENTIAL/PLATELET
BASOS PCT: 0 %
Basophils Absolute: 0 10*3/uL (ref 0–0.1)
EOS ABS: 0 10*3/uL (ref 0–0.7)
Eosinophils Relative: 0 %
HEMATOCRIT: 37.5 % (ref 35.0–47.0)
HEMOGLOBIN: 12.8 g/dL (ref 12.0–16.0)
Lymphocytes Relative: 3 %
Lymphs Abs: 0.3 10*3/uL — ABNORMAL LOW (ref 1.0–3.6)
MCH: 39 pg — ABNORMAL HIGH (ref 26.0–34.0)
MCHC: 34.1 g/dL (ref 32.0–36.0)
MCV: 114.5 fL — ABNORMAL HIGH (ref 80.0–100.0)
MONO ABS: 0.4 10*3/uL (ref 0.2–0.9)
MONOS PCT: 4 %
NEUTROS ABS: 7.9 10*3/uL — AB (ref 1.4–6.5)
NEUTROS PCT: 93 %
Platelets: 237 10*3/uL (ref 150–440)
RBC: 3.28 MIL/uL — ABNORMAL LOW (ref 3.80–5.20)
RDW: 16.7 % — AB (ref 11.5–14.5)
WBC: 8.6 10*3/uL (ref 3.6–11.0)

## 2018-03-05 LAB — HEMOGLOBIN A1C
HEMOGLOBIN A1C: 5.6 % (ref 4.8–5.6)
Mean Plasma Glucose: 114.02 mg/dL

## 2018-03-05 LAB — URINALYSIS, COMPLETE (UACMP) WITH MICROSCOPIC
Bacteria, UA: NONE SEEN
Bilirubin Urine: NEGATIVE
GLUCOSE, UA: 150 mg/dL — AB
HGB URINE DIPSTICK: NEGATIVE
KETONES UR: NEGATIVE mg/dL
LEUKOCYTES UA: NEGATIVE
NITRITE: NEGATIVE
PROTEIN: NEGATIVE mg/dL
RBC / HPF: NONE SEEN RBC/hpf (ref 0–5)
Specific Gravity, Urine: 1.011 (ref 1.005–1.030)
pH: 6 (ref 5.0–8.0)

## 2018-03-05 LAB — LACTIC ACID, PLASMA
LACTIC ACID, VENOUS: 3.7 mmol/L — AB (ref 0.5–1.9)
LACTIC ACID, VENOUS: 2.9 mmol/L — AB (ref 0.5–1.9)

## 2018-03-05 LAB — GLUCOSE, CAPILLARY
GLUCOSE-CAPILLARY: 111 mg/dL — AB (ref 65–99)
Glucose-Capillary: 244 mg/dL — ABNORMAL HIGH (ref 65–99)
Glucose-Capillary: 116 mg/dL — ABNORMAL HIGH (ref 65–99)
Glucose-Capillary: 104 mg/dL — ABNORMAL HIGH (ref 65–99)

## 2018-03-05 LAB — URINE DRUG SCREEN, QUALITATIVE (ARMC ONLY)
AMPHETAMINES, UR SCREEN: NOT DETECTED
Barbiturates, Ur Screen: NOT DETECTED
Benzodiazepine, Ur Scrn: NOT DETECTED
Cannabinoid 50 Ng, Ur ~~LOC~~: NOT DETECTED
Cocaine Metabolite,Ur ~~LOC~~: NOT DETECTED
MDMA (ECSTASY) UR SCREEN: NOT DETECTED
METHADONE SCREEN, URINE: NOT DETECTED
Opiate, Ur Screen: NOT DETECTED
PHENCYCLIDINE (PCP) UR S: NOT DETECTED
TRICYCLIC, UR SCREEN: NOT DETECTED

## 2018-03-05 LAB — INFLUENZA PANEL BY PCR (TYPE A & B)
INFLAPCR: NEGATIVE
Influenza B By PCR: NEGATIVE

## 2018-03-05 LAB — MAGNESIUM: Magnesium: 1.4 mg/dL — ABNORMAL LOW (ref 1.7–2.4)

## 2018-03-05 MED ORDER — ADULT MULTIVITAMIN W/MINERALS CH
1.0000 | ORAL_TABLET | Freq: Every day | ORAL | Status: DC
  Administered 2018-03-06: 1 via ORAL
  Filled 2018-03-05: qty 1

## 2018-03-05 MED ORDER — ATORVASTATIN CALCIUM 20 MG PO TABS: 40.0000 mg | ORAL_TABLET | Freq: Every day | ORAL | Status: DC

## 2018-03-05 MED ORDER — INSULIN GLARGINE 100 UNIT/ML ~~LOC~~ SOLN
10.0000 [IU] | Freq: Every day | SUBCUTANEOUS | Status: DC
  Filled 2018-03-05 (×2): qty 0.1

## 2018-03-05 MED ORDER — SODIUM CHLORIDE 0.9 % IV BOLUS (SEPSIS)
1000.0000 mL | Freq: Once | INTRAVENOUS | Status: AC
  Administered 2018-03-05: 1000 mL via INTRAVENOUS

## 2018-03-05 MED ORDER — IPRATROPIUM-ALBUTEROL 0.5-2.5 (3) MG/3ML IN SOLN
3.0000 mL | Freq: Four times a day (QID) | RESPIRATORY_TRACT | Status: DC
  Administered 2018-03-05 – 2018-03-06 (×2): 3 mL via RESPIRATORY_TRACT
  Filled 2018-03-05 (×4): qty 3

## 2018-03-05 MED ORDER — ACETAMINOPHEN 650 MG RE SUPP: 650.0000 mg | Freq: Four times a day (QID) | RECTAL | Status: DC | PRN

## 2018-03-05 MED ORDER — SODIUM CHLORIDE 0.9 % IV SOLN
500.0000 mg | Freq: Once | INTRAVENOUS | Status: AC
  Administered 2018-03-05: 500 mg via INTRAVENOUS
  Filled 2018-03-05: qty 500

## 2018-03-05 MED ORDER — BUDESONIDE 0.5 MG/2ML IN SUSP
0.5000 mg | Freq: Two times a day (BID) | RESPIRATORY_TRACT | Status: DC
  Administered 2018-03-05 – 2018-03-06 (×2): 0.5 mg via RESPIRATORY_TRACT
  Filled 2018-03-05 (×2): qty 2

## 2018-03-05 MED ORDER — POTASSIUM CHLORIDE CRYS ER 20 MEQ PO TBCR
40.0000 meq | EXTENDED_RELEASE_TABLET | Freq: Once | ORAL | Status: AC
  Administered 2018-03-06: 40 meq via ORAL
  Filled 2018-03-05: qty 2

## 2018-03-05 MED ORDER — MAGNESIUM SULFATE 2 GM/50ML IV SOLN
2.0000 g | Freq: Once | INTRAVENOUS | Status: AC
  Administered 2018-03-05: 2 g via INTRAVENOUS
  Filled 2018-03-05: qty 50

## 2018-03-05 MED ORDER — SODIUM CHLORIDE 0.9 % IV SOLN
1.0000 g | Freq: Once | INTRAVENOUS | Status: AC
  Administered 2018-03-05: 1 g via INTRAVENOUS
  Filled 2018-03-05: qty 10

## 2018-03-05 MED ORDER — ACETAMINOPHEN 650 MG RE SUPP
RECTAL | Status: AC
  Filled 2018-03-05: qty 1

## 2018-03-05 MED ORDER — LORAZEPAM 2 MG/ML IJ SOLN
1.0000 mg | Freq: Once | INTRAMUSCULAR | Status: AC
  Administered 2018-03-05: 1 mg via INTRAVENOUS
  Filled 2018-03-05: qty 1

## 2018-03-05 MED ORDER — PANTOPRAZOLE SODIUM 40 MG PO TBEC
40.0000 mg | DELAYED_RELEASE_TABLET | Freq: Every day | ORAL | Status: DC
  Administered 2018-03-06: 40 mg via ORAL
  Filled 2018-03-05: qty 1

## 2018-03-05 MED ORDER — FOLIC ACID 1 MG PO TABS
1.0000 mg | ORAL_TABLET | Freq: Every day | ORAL | Status: DC
  Administered 2018-03-06: 1 mg via ORAL
  Filled 2018-03-05: qty 1

## 2018-03-05 MED ORDER — ONDANSETRON 4 MG PO TBDP
4.0000 mg | ORAL_TABLET | Freq: Three times a day (TID) | ORAL | Status: DC | PRN
  Filled 2018-03-05: qty 1

## 2018-03-05 MED ORDER — INSULIN ASPART 100 UNIT/ML ~~LOC~~ SOLN: 0.0000 [IU] | Freq: Every day | SUBCUTANEOUS | Status: DC

## 2018-03-05 MED ORDER — THIAMINE HCL 100 MG/ML IJ SOLN: 100.0000 mg | Freq: Every day | INTRAMUSCULAR | Status: DC

## 2018-03-05 MED ORDER — DIAZEPAM 5 MG/ML IJ SOLN: 5.0000 mg | Freq: Once | INTRAMUSCULAR | Status: DC

## 2018-03-05 MED ORDER — POTASSIUM CHLORIDE 10 MEQ/100ML IV SOLN
10.0000 meq | INTRAVENOUS | Status: AC
  Administered 2018-03-05 (×3): 10 meq via INTRAVENOUS
  Filled 2018-03-05 (×3): qty 100

## 2018-03-05 MED ORDER — VITAMIN B-1 100 MG PO TABS
100.0000 mg | ORAL_TABLET | Freq: Every day | ORAL | Status: DC
  Administered 2018-03-06: 100 mg via ORAL
  Filled 2018-03-05: qty 1

## 2018-03-05 MED ORDER — POTASSIUM CHLORIDE CRYS ER 20 MEQ PO TBCR
20.0000 meq | EXTENDED_RELEASE_TABLET | Freq: Two times a day (BID) | ORAL | Status: DC
  Administered 2018-03-05: 20 meq via ORAL
  Filled 2018-03-05: qty 1

## 2018-03-05 MED ORDER — LABETALOL HCL 5 MG/ML IV SOLN
10.0000 mg | Freq: Once | INTRAVENOUS | Status: AC
  Administered 2018-03-05: 10 mg via INTRAVENOUS
  Filled 2018-03-05: qty 4

## 2018-03-05 MED ORDER — ASPIRIN EC 81 MG PO TBEC
81.0000 mg | DELAYED_RELEASE_TABLET | Freq: Every day | ORAL | Status: DC
  Administered 2018-03-06: 81 mg via ORAL
  Filled 2018-03-05: qty 1

## 2018-03-05 MED ORDER — LORAZEPAM 1 MG PO TABS: 1.0000 mg | ORAL_TABLET | Freq: Four times a day (QID) | ORAL | Status: DC | PRN

## 2018-03-05 MED ORDER — PIPERACILLIN-TAZOBACTAM 3.375 G IVPB
3.3750 g | Freq: Three times a day (TID) | INTRAVENOUS | Status: DC
  Administered 2018-03-05 – 2018-03-06 (×3): 3.375 g via INTRAVENOUS
  Filled 2018-03-05 (×3): qty 50

## 2018-03-05 MED ORDER — AZITHROMYCIN 250 MG PO TABS
250.0000 mg | ORAL_TABLET | Freq: Every day | ORAL | Status: DC
  Administered 2018-03-06: 250 mg via ORAL
  Filled 2018-03-05: qty 1

## 2018-03-05 MED ORDER — NICOTINE 14 MG/24HR TD PT24
14.0000 mg | MEDICATED_PATCH | Freq: Every day | TRANSDERMAL | Status: DC
  Administered 2018-03-05 – 2018-03-06 (×2): 14 mg via TRANSDERMAL
  Filled 2018-03-05 (×2): qty 1

## 2018-03-05 MED ORDER — ACETAMINOPHEN 325 MG PO TABS: 650.0000 mg | ORAL_TABLET | Freq: Four times a day (QID) | ORAL | Status: DC | PRN

## 2018-03-05 MED ORDER — LORAZEPAM 2 MG/ML IJ SOLN: 1.0000 mg | Freq: Four times a day (QID) | INTRAMUSCULAR | Status: DC | PRN

## 2018-03-05 MED ORDER — INSULIN ASPART 100 UNIT/ML ~~LOC~~ SOLN
0.0000 [IU] | Freq: Three times a day (TID) | SUBCUTANEOUS | Status: DC
  Administered 2018-03-05: 3 [IU] via SUBCUTANEOUS
  Filled 2018-03-05: qty 1

## 2018-03-05 MED ORDER — ACETAMINOPHEN 325 MG PO TABS
650.0000 mg | ORAL_TABLET | Freq: Once | ORAL | Status: AC
  Administered 2018-03-05: 650 mg via ORAL
  Filled 2018-03-05: qty 2

## 2018-03-05 NOTE — ED Notes (Signed)
[facility] doctor recommends discharge with AA information.

## 2018-03-05 NOTE — ED Notes (Signed)
Pt. Had been cleared by [facility] of IVC and allowed to go home.

## 2018-03-05 NOTE — Progress Notes (Signed)
Pharmacy Antibiotic Note  Aimee Buck is a 57 y.o. female admitted on 03/04/2018 with aspiration pneumonia.  Pharmacy has been consulted for Zosyn dosing.  Plan: Zosyn 3.375 gm IV Q8H EI  Height: 5\' 5"  (165.1 cm) Weight: 196 lb (88.9 kg) IBW/kg (Calculated) : 57  Temp (24hrs), Avg:100.4 F (38 C), Min:98.9 F (37.2 C), Max:101.9 F (38.8 C)  Recent Labs  Lab 03/04/18 1544 03/05/18 0548  WBC 4.0  --   CREATININE 1.13*  --   LATICACIDVEN  --  3.7*    Estimated Creatinine Clearance: 62.7 mL/min (A) (by C-G formula based on SCr of 1.13 mg/dL (H)).    Allergies  Allergen Reactions  . Metrizamide Itching and Swelling    Previous contrast allergy as a young adult Previous contrast allergy as a young adult  . Contrast Media [Iodinated Diagnostic Agents] Itching and Swelling    Previous contrast allergy as a young adult    Antimicrobials this admission: Ceftriaxone in ED x 1 Azithromycin IV x 1 in ED followed by oral per admitting MD  Dose adjustments this admission:   Microbiology results: 3/17 BCx: pending 3/17 Influenza negative   Thank you for allowing pharmacy to be a part of this patient's care.  Laural Benes, Pharm.D., BCPS Clinical Pharmacist 03/05/2018 7:51 AM

## 2018-03-05 NOTE — Progress Notes (Signed)
Pt stating she came into the ED with a large amount of money.Pt states "I had 1660.00 in cash, and I want my money". Nurse contacted ED. Per Brandi in the ED the patients money is locked up in the safe. Brandi sending yellow slip and key in tube station. Yellow slip and key received and placed in her chart. Pt notified.

## 2018-03-05 NOTE — H&P (Signed)
[place] [facility] [place] NAME: Aimee Buck    MR#:  [id number]  DATE OF BIRTH:  02-26-1961  DATE OF ADMISSION:  03/04/2018  PRIMARY CARE PHYSICIAN: Gennette Pac, FNP   REQUESTING/REFERRING PHYSICIAN: Dr Arta Silence  CHIEF COMPLAINT:   Chief Complaint  Patient presents with  . Psychiatric Evaluation    HISTORY OF PRESENT ILLNESS:  Aimee Buck  is a 57 y.o. female came in for psychiatric evaluation stated she drank alcohol trying to hurt himself.  She was in the ER all day and then overnight had a fever and was tachycardic and tachypneic.  Chest x-ray showing pneumonia.  Patient is lethargic and unable to give much history.  Hospitalist services were contacted for further evaluation.  Patient states that she has gone through withdrawal in the past.  Physically she offered no complaints.  PAST MEDICAL HISTORY:   Past Medical History:  Diagnosis Date  . Asthma   . COPD (chronic obstructive pulmonary disease) ([place])   . Diabetes ([place])   . Hypertension     PAST SURGICAL HISTORY:   Past Surgical History:  Procedure Laterality Date  . ANKLE SURGERY Left     SOCIAL HISTORY:   Social History   Tobacco Use  . Smoking status: Current Every Day Smoker    Packs/day: 0.50    Types: Cigarettes  . Smokeless tobacco: Never Used  Substance Use Topics  . Alcohol use: Yes    Alcohol/week: 8.4 oz    Types: 14 Cans of beer per week    FAMILY HISTORY:   Family History  Problem Relation Age of Onset  . Hypertension Mother   . Hypertension Father   . CAD Father   . Heart failure Father   . Breast cancer Neg Hx     DRUG ALLERGIES:   Allergies  Allergen Reactions  . Metrizamide Itching and Swelling    Previous contrast allergy as a young adult Previous contrast allergy as a young adult  . Contrast Media [Iodinated Diagnostic Agents] Itching and Swelling    Previous contrast allergy as a young adult     REVIEW OF SYSTEMS:   Limited with lethargy  CONSTITUTIONAL: Positive for fever RESPIRATORY: Denied  Cough  orshortness of breath.  CARDIOVASCULAR: No chest pain.  GASTROINTESTINAL: No nausea, vomiting, or abdominal pain. PSYCHIATRY:  stated she was drinking because she is depressed  MEDICATIONS AT HOME:   Prior to Admission medications   Medication Sig Start Date End Date Taking? Authorizing Provider  aspirin EC 81 MG tablet Take 81 mg by mouth daily.    [provider]  atorvastatin (LIPITOR) 40 MG tablet Take 1 tablet (40 mg total) by mouth daily at 6 PM. 10/16/16   Demetrios Loll, MD  Fluticasone-Salmeterol (ADVAIR) 100-50 MCG/DOSE AEPB Inhale 1 puff into the lungs 2 (two) times daily.    [provider]  gabapentin (NEURONTIN) 100 MG capsule Take 2 capsules (200 mg total) by mouth 2 (two) times daily. 10/16/16   Demetrios Loll, MD  insulin aspart (NOVOLOG) 100 UNIT/ML injection Inject 15 Units into the skin 3 (three) times daily with meals. 10/16/16   Demetrios Loll, MD  insulin glargine (LANTUS) 100 UNIT/ML injection Inject 0.3 mLs (30 Units total) into the skin 2 (two) times daily. Patient taking differently: Inject 35 Units into the skin at bedtime.  10/16/16   Demetrios Loll, MD  lisinopril (PRINIVIL,ZESTRIL) 10 MG tablet Take 10 mg by mouth daily.    [provider]  loperamide (IMODIUM A-D) 2 MG tablet Take 2 tablets (4 mg total) by mouth 4 (four) times daily as needed for diarrhea or loose stools. 08/30/17   Carrie Mew, MD  naproxen (NAPROSYN) 500 MG tablet Take 1 tablet (500 mg total) by mouth 2 (two) times daily with a meal. 08/30/17   Carrie Mew, MD  omeprazole (PRILOSEC) 40 MG capsule Take 40 mg by mouth daily. 10/28/16   [provider]  ondansetron (ZOFRAN ODT) 4 MG disintegrating tablet Take 1 tablet (4 mg total) by mouth every 8 (eight) hours as needed for nausea or vomiting. 08/30/17   Carrie Mew, MD  traMADol (ULTRAM) 50 MG  tablet Take 50 mg by mouth every 6 (six) hours as needed.    [provider]      VITAL SIGNS:  Blood pressure (!) 134/92, pulse (!) 115, temperature (!) 101.9 F (38.8 C), temperature source Axillary, resp. rate (!) 33, height 5\' 5"  (1.651 m), weight 88.9 kg (196 lb), SpO2 98 %.  PHYSICAL EXAMINATION:  GENERAL:  57 y.o.-year-old patient lying in the bed with acute distress.  EYES: Pupils equal, round, reactive to light and accommodation. No scleral icterus.  Sclerae are bloodshot HEENT: Head atraumatic, normocephalic. Oropharynx and nasopharynx clear.  NECK:  Supple, no jugular venous distention. No thyroid enlargement, no tenderness.  LUNGS:  decreased breath sounds bilaterally, no wheezing, rales,rhonchi or crepitation.  positive use of accessory muscles of respiration.  CARDIOVASCULAR: S1, S2  tachycardic. No murmurs, rubs, or gallops.  ABDOMEN: Soft, nontender, nondistended. Bowel sounds present. No organomegaly or mass.  EXTREMITIES:  trace pedal edema, no cyanosis, or clubbing.  NEUROLOGIC:  patient is lethargic but moves all of her extremities PSYCHIATRIC: The patient is lethargic but answers a few yes or no questions.  SKIN: No rash, lesion, or ulcer.   LABORATORY PANEL:   CBC Recent Labs  Lab 03/04/18 1544  WBC 4.0  HGB 13.2  HCT 39.3  PLT 273   ------------------------------------------------------------------------------------------------------------------  Chemistries  Recent Labs  Lab 03/04/18 1544  NA 144  K 2.8*  CL 108  CO2 26  GLUCOSE 112*  BUN 8  CREATININE 1.13*  CALCIUM 8.7*  AST 28  ALT 16  ALKPHOS 106  BILITOT 0.5   ------------------------------------------------------------------------------------------------------------------   RADIOLOGY:  Dg Chest Portable 1 View  Result Date: 03/05/2018 CLINICAL DATA:  Shortness of breath. EXAM: PORTABLE CHEST 1 VIEW COMPARISON:  06/03/2011 FINDINGS: Mild cardiomegaly. Normal mediastinal  contours. Bilateral perihilar opacities, left greater than right. Ill-defined bibasilar opacities, also left greater than right. No pneumothorax. IMPRESSION: Mild cardiomegaly. Perihilar and bibasilar opacities, left greater than right may be pulmonary edema, multifocal pneumonia, atelectasis or combination thereof. Electronically Signed   By: Jeb Levering M.D.   On: 03/05/2018 05:28    EKG:   Sinus tachycardia 112 bpm, nonspecific ST-T wave changes  IMPRESSION AND PLAN:   1.  Clinical sepsis with likely aspiration pneumonia, tachypnea, tachycardia, fever and acute encephalopathy.  Switch antibiotics to Zosyn and Zithromax.  IV fluid hydration. 2.  Alcohol abuse.  Put on CIWA protocol 3.  Type 2 diabetes mellitus.  Put on low-dose Lantus and sliding scale 4.  Essential hypertension.  Monitor off antihypertensive medication right now. 5.  Hyperlipidemia unspecified on atorvastatin 6.  Hypokalemia.  Replace potassium orally and IV.  Also replace magnesium. 7.  COPD.  Will start on nebulizer treatments budesonide and DuoNeb.  Try to hold off on systemic steroids. 8.  Tobacco abuse smoking  cessation counseling done 4 minutes by me.  Nicotine patch prescribed  I have personally reviewed the EKG, yesterday's laboratory data and chest x-ray  Management plans discussed with the patient,  and she is  in agreement.  I left message for the patient's husband  CODE STATUS: Full code  TOTAL TIME TAKING CARE OF THIS PATIENT: 50 minutes.    Loletha Grayer M.D on 03/05/2018 at 7:50 AM  Between 7am to 6pm - Pager - [phone]  After 6pm call admission pager [phone]  [facility]  [phone]  CC: Primary care physician; Gennette Pac, [place]

## 2018-03-05 NOTE — ED Notes (Signed)
Hospitalist to bedside at this time 

## 2018-03-05 NOTE — Progress Notes (Signed)
CODE SEPSIS - PHARMACY COMMUNICATION  **Broad Spectrum Antibiotics should be administered within 1 hour of Sepsis diagnosis**  Time Code Sepsis Called/Page Received: 3685  Antibiotics Ordered: azithro/ceftriaxone  Time of 1st antibiotic administration: 0550  Additional action taken by pharmacy:   If necessary, Name of Provider/Nurse Contacted:     Tobie Lords ,PharmD Clinical Pharmacist  03/05/2018  5:54 AM

## 2018-03-05 NOTE — ED Notes (Signed)
Report received on pt from [place], [place]. Pt moved to Rm 19 and care assumed. Pt placed on CCM and has O2 at 2l/min via [place]. Pt noted to have red frothy drainage from he nares and Dr Cherylann Banas is aware. Pt ST on the monitor with HR or 115 at this time. Resp rate of 32 to 39 noted and etCO2 from 21 to 25 noted. Will continue to monitor.

## 2018-03-05 NOTE — ED Notes (Signed)
Attempted to call report; RN unavailable at this time.   

## 2018-03-05 NOTE — Clinical Social Work Note (Addendum)
CSW aware via chart review that the patient is admitted due to IVC secondary to suicidal ideations.   CSW attempted to visit the patient who was sleeping soundly and would not rouse. CSW will assess when able.  Santiago Bumpers, MSW, Latanya Presser [phone]

## 2018-03-05 NOTE — ED Notes (Signed)
Report given to SOC 

## 2018-03-05 NOTE — ED Notes (Signed)
Critical lactic value of 3.7 given to MD.

## 2018-03-05 NOTE — ED Notes (Signed)
SOC started for patient.

## 2018-03-05 NOTE — ED Provider Notes (Addendum)
-----------------------------------------   4:21 AM on 03/05/2018 -----------------------------------------  I received signout on this patient from Dr. Archie Balboa.  Patient was pending [facility] evaluation and reassessment to sobriety.  Patient is now clinically sober.  She has been evaluated by [facility], who has rescinded the IVC and recommends discharge.     However, on reassessment, the patient is now tachycardic to as high as 140, tachypneic, very anxious appearing, and stating that her mouth is very dry.  Her lungs are clear, and O2 sat is in the high 90s on room air.  She has no tremor and has only mild tongue fasciculation.  It is possible that she has developed alcohol withdrawal, although other causes are possible.  We will obtain chest x-ray, give IV fluids and ativan, and reassess.  Patient may require admission from medical perspective for alcohol withdrawal.  ----------------------------------------- 5:35 AM on 03/05/2018 -----------------------------------------  The patient was found to be febrile as well.  I gave her a low-dose of Ativan as well as some fluids, however the patient continues to be significantly hypertensive, tachycardic, and tachypneic.  She is somewhat somnolent after the Ativan, but is maintaining her airway. Her lungs now have rales bilaterally.  X-ray shows bilateral infiltrates, but the patient has no prior history of CHF.  Therefore most likely cause of patient's worsening clinical status is pneumonia.  It is possible that patient had aspiration at some point related to her intoxication and/or sedation.  The patient has not been hospitalized within the last 90 days, so I ordered antibiotics for CAP.   Based on her VS and clinical status, I have activated code sepsis.  I signed the patient out to the hospitalist Dr. Jerelyn Charles for admission.  ED ECG REPORT I, Arta Silence, the attending physician, personally viewed and interpreted this ECG.  Date: 03/05/2018 EKG Time:  0549 Rate: 112 Rhythm: Sinus tachycardia QRS Axis: normal Intervals: Prolonged QTC ST/T Wave abnormalities: normal Narrative Interpretation: no evidence of acute ischemia   CRITICAL CARE Performed by: Arta Silence   Total critical care time: 40 minutes  Critical care time was exclusive of separately billable procedures and treating other patients.  Critical care was necessary to treat or prevent imminent or life-threatening deterioration.  Critical care was time spent personally by me on the following activities: development of treatment plan with patient and/or surrogate as well as nursing, discussions with consultants, evaluation of patient's response to treatment, examination of patient, obtaining history from patient or surrogate, ordering and performing treatments and interventions, ordering and review of laboratory studies, ordering and review of radiographic studies, pulse oximetry and re-evaluation of patient's condition.      Arta Silence, MD 03/05/18 [phone]

## 2018-03-05 NOTE — ED Notes (Signed)
1A RN called to inquire about patient belongings; this RN did not get any report in regards to patient belongings, advised Freda Munro, RN that belongings may have been locked up in [facility] locker room, and that security could be called to obtain them if so.

## 2018-03-05 NOTE — Consult Note (Addendum)
[facility] [facility] Consult   Reason for Consult:  Voiced suicidal statement Referring Physician:  Dr. Phillip Heal Patient Identification: Aimee Buck MRN:  [id number] Principal Diagnosis: <principal problem not specified> Diagnosis:   Patient Active Problem List   Diagnosis Date Noted  . Pneumonia [J18.9] 03/05/2018  . Pyelonephritis [N12] 03/09/2017  . Hyperglycemia [R73.9] 10/14/2016    Total Time spent with patient: 1 hour  Subjective:   "I said that because I was angry, I wanna go home now" HPI-  Aimee Buck is a 57 y.o. female patient was brought to ED after  she drank alcohol trying to hurt himself. She has been evaluated by [facility], who has rescinded the IVC.  She was admitted to medical floor for PNA.   Per report, Patient stated that she has gone through withdrawal in the past. Patient states that she has been under a lot of stress recently. Has made an appointment with a mental health provider for the upcoming week however apparently today has been drinking and has been expressing suicidal ideation.  Pt apparently was very agitated yesterday needing po 70m haldol and also im 242mGeodon. Alcohol level was 344.  Pt in bed, anxious, admits stating  suicidal ideation but denies that now. Pt reports drinking alcohol 2-3 times a week, states its not a problem and demanding to release her now. Denies any need of tx at this time( for both alcohol and depression), states "I will deal with it myself". Admits some depression and anxiety , pt guarded , unable to logically explain the events , preoccupied with leaving hospital now, agrees for collateral. Pt unable to recall yesterday's agitation.  Husband reports pt has been drinking excessively for last 4 months upto 1/2 gallon of Vodka a a day, started to drink almost everyday, although it was initially on weekends. Husband reports pt has been depressed for last several weeks, more anxious, irritable, not wanting help  for her drinking problem, not sleeping well. Husband concerned about her depression, and drinking habit, believes not safe to send her home at this time.        Past Psychiatric History: No psych hospitalization, no psych meds. No suicide attempt. No h/o HI/aggression.   Risk to Self: Is patient at risk for suicide?: No Risk to Others:   Prior Inpatient Therapy:   Prior Outpatient Therapy:    Past Medical History:  Past Medical History:  Diagnosis Date  . Asthma   . COPD (chronic obstructive pulmonary disease) ([place]  . Diabetes ([place]  . Hypertension     Past Surgical History:  Procedure Laterality Date  . ANKLE SURGERY Left    Family History:  Family History  Problem Relation Age of Onset  . Hypertension Mother   . Hypertension Father   . CAD Father   . Heart failure Father   . Breast cancer Neg Hx    Family Psychiatric  History: unknown Social History:  Social History   Substance and Sexual Activity  Alcohol Use Yes  . Alcohol/week: 8.4 oz  . Types: 14 Cans of beer per week     Social History   Substance and Sexual Activity  Drug Use No    Social History   Socioeconomic History  . Marital status: Married    Spouse name: None  . Number of children: None  . Years of education: None  . Highest education level: None  Social Needs  . Financial resource strain: None  . Food insecurity -  worry: None  . Food insecurity - inability: None  . Transportation needs - medical: None  . Transportation needs - non-medical: None  Occupational History  . None  Tobacco Use  . Smoking status: Current Every Day Smoker    Packs/day: 0.50    Types: Cigarettes  . Smokeless tobacco: Never Used  Substance and Sexual Activity  . Alcohol use: Yes    Alcohol/week: 8.4 oz    Types: 14 Cans of beer per week  . Drug use: No  . Sexual activity: Yes    Birth control/protection: None  Other Topics Concern  . None  Social History Narrative   Lives at home with husband    Additional Social History:    Allergies:   Allergies  Allergen Reactions  . Metrizamide Itching and Swelling    Previous contrast allergy as a young adult Previous contrast allergy as a young adult  . Contrast Media [Iodinated Diagnostic Agents] Itching and Swelling    Previous contrast allergy as a young adult    Labs:  Results for orders placed or performed during the hospital encounter of 03/04/18 (from the past 48 hour(s))  Comprehensive metabolic panel     Status: Abnormal   Collection Time: 03/04/18  3:44 PM  Result Value Ref Range   Sodium 144 135 - 145 mmol/L   Potassium 2.8 (L) 3.5 - 5.1 mmol/L   Chloride 108 101 - 111 mmol/L   CO2 26 22 - 32 mmol/L   Glucose, Bld 112 (H) 65 - 99 mg/dL   BUN 8 6 - 20 mg/dL   Creatinine, Ser 1.13 (H) 0.44 - 1.00 mg/dL   Calcium 8.7 (L) 8.9 - 10.3 mg/dL   Total Protein 7.1 6.5 - 8.1 g/dL   Albumin 3.4 (L) 3.5 - 5.0 g/dL   AST 28 15 - 41 U/L   ALT 16 14 - 54 U/L   Alkaline Phosphatase 106 38 - 126 U/L   Total Bilirubin 0.5 0.3 - 1.2 mg/dL   GFR calc non Af Amer 54 (L) >60 mL/min   GFR calc Af Amer >60 >60 mL/min    Comment: (NOTE) The eGFR has been calculated using the CKD EPI equation. This calculation has not been validated in all clinical situations. eGFR's persistently <60 mL/min signify possible Chronic Kidney Disease.    Anion gap 10 5 - 15    Comment: Performed at [facility], [address] [place], [place] [address]  Ethanol     Status: Abnormal   Collection Time: 03/04/18  3:44 PM  Result Value Ref Range   Alcohol, Ethyl (B) 344 (HH) <10 mg/dL    Comment: CRITICAL RESULT CALLED TO, READ BACK BY AND VERIFIED WITH KIM GAULT ON 03/04/18 AT 1621 QSD        LOWEST DETECTABLE LIMIT FOR SERUM ALCOHOL IS 10 mg/dL FOR MEDICAL PURPOSES ONLY Performed at [facility], [address] [place], [place] [address]   Salicylate level     Status: None   Collection Time: 03/04/18  3:44 PM  Result Value  Ref Range   Salicylate Lvl <9.8 2.8 - 30.0 mg/dL    Comment: Performed at [facility], [address] [place], [place] [address]  Acetaminophen level     Status: Abnormal   Collection Time: 03/04/18  3:44 PM  Result Value Ref Range   Acetaminophen (Tylenol), Serum <10 (L) 10 - 30 ug/mL    Comment:        THERAPEUTIC CONCENTRATIONS VARY  SIGNIFICANTLY. A RANGE OF 10-30 ug/mL MAY BE AN EFFECTIVE CONCENTRATION FOR MANY PATIENTS. HOWEVER, SOME ARE BEST TREATED AT CONCENTRATIONS OUTSIDE THIS RANGE. ACETAMINOPHEN CONCENTRATIONS >150 ug/mL AT 4 HOURS AFTER INGESTION AND >50 ug/mL AT 12 HOURS AFTER INGESTION ARE OFTEN ASSOCIATED WITH TOXIC REACTIONS. Performed at [facility], [address] [place], [place] [address]   cbc     Status: Abnormal   Collection Time: 03/04/18  3:44 PM  Result Value Ref Range   WBC 4.0 3.6 - 11.0 K/uL   RBC 3.44 (L) 3.80 - 5.20 MIL/uL   Hemoglobin 13.2 12.0 - 16.0 g/dL   HCT 39.3 35.0 - 47.0 %   MCV 114.4 (H) 80.0 - 100.0 fL   MCH 38.4 (H) 26.0 - 34.0 pg   MCHC 33.6 32.0 - 36.0 g/dL   RDW 16.5 (H) 11.5 - 14.5 %   Platelets 273 150 - 440 K/uL    Comment: Performed at [facility], [address], [place], [place] [address]  Urine Drug Screen, Qualitative     Status: None   Collection Time: 03/05/18  5:45 AM  Result Value Ref Range   Tricyclic, Ur Screen NONE DETECTED NONE DETECTED   Amphetamines, Ur Screen NONE DETECTED NONE DETECTED   MDMA (Ecstasy)Ur Screen NONE DETECTED NONE DETECTED   Cocaine Metabolite,Ur [place] NONE DETECTED NONE DETECTED   Opiate, Ur Screen NONE DETECTED NONE DETECTED   Phencyclidine (PCP) Ur S NONE DETECTED NONE DETECTED   Cannabinoid 50 Ng, Ur [place] NONE DETECTED NONE DETECTED   Barbiturates, Ur Screen NONE DETECTED NONE DETECTED   Benzodiazepine, Ur Scrn NONE DETECTED NONE DETECTED   Methadone Scn, Ur NONE DETECTED NONE DETECTED    Comment: (NOTE) Tricyclics + metabolites, urine    Cutoff 1000  ng/mL Amphetamines + metabolites, urine  Cutoff 1000 ng/mL MDMA (Ecstasy), urine              Cutoff 500 ng/mL Cocaine Metabolite, urine          Cutoff 300 ng/mL Opiate + metabolites, urine        Cutoff 300 ng/mL Phencyclidine (PCP), urine         Cutoff 25 ng/mL Cannabinoid, urine                 Cutoff 50 ng/mL Barbiturates + metabolites, urine  Cutoff 200 ng/mL Benzodiazepine, urine              Cutoff 200 ng/mL Methadone, urine                   Cutoff 300 ng/mL The urine drug screen provides only a preliminary, unconfirmed analytical test result and should not be used for non-medical purposes. Clinical consideration and professional judgment should be applied to any positive drug screen result due to possible interfering substances. A more specific alternate chemical method must be used in order to obtain a confirmed analytical result. Gas chromatography / mass spectrometry (GC/MS) is the preferred confirmat ory method. Performed at [facility], [address] [place],  [address]   Urinalysis, Complete w Microscopic     Status: Abnormal   Collection Time: 03/05/18  5:45 AM  Result Value Ref Range   Color, Urine YELLOW (A) YELLOW   APPearance CLEAR (A) CLEAR   Specific Gravity, Urine 1.011 1.005 - 1.030   pH 6.0 5.0 - 8.0   Glucose, UA 150 (A) NEGATIVE mg/dL   Hgb urine dipstick NEGATIVE NEGATIVE   Bilirubin Urine NEGATIVE  NEGATIVE   Ketones, ur NEGATIVE NEGATIVE mg/dL   Protein, ur NEGATIVE NEGATIVE mg/dL   Nitrite NEGATIVE NEGATIVE   Leukocytes, UA NEGATIVE NEGATIVE   RBC / HPF NONE SEEN 0 - 5 RBC/hpf   WBC, UA 0-5 0 - 5 WBC/hpf   Bacteria, UA NONE SEEN NONE SEEN   Squamous Epithelial / LPF 0-5 (A) NONE SEEN   Mucus PRESENT     Comment: Performed at [facility], [address] [place], [place] [address]  Influenza panel by PCR (type A & B)     Status: None   Collection Time: 03/05/18  5:47 AM  Result Value Ref Range   Influenza A By  PCR NEGATIVE NEGATIVE   Influenza B By PCR NEGATIVE NEGATIVE    Comment: (NOTE) The Xpert Xpress Flu assay is intended as an aid in the diagnosis of  influenza and should not be used as a sole basis for treatment.  This  assay is FDA approved for nasopharyngeal swab specimens only. Nasal  washings and aspirates are unacceptable for Xpert Xpress Flu testing. Performed at [facility], [address] [place], [place] [address]   Culture, blood (routine x 2)     Status: None (Preliminary result)   Collection Time: 03/05/18  5:47 AM  Result Value Ref Range   Specimen Description BLOOD BLOOD RIGHT ARM    Special Requests      BOTTLES DRAWN AEROBIC AND ANAEROBIC Blood Culture results may not be optimal due to an excessive volume of blood received in culture bottles   Culture      NO GROWTH < 12 HOURS Performed at [facility], [address], [place], [place] [address]    Report Status PENDING   Lactic acid, plasma     Status: Abnormal   Collection Time: 03/05/18  5:48 AM  Result Value Ref Range   Lactic Acid, Venous 3.7 (HH) 0.5 - 1.9 mmol/L    Comment: CRITICAL RESULT CALLED TO, READ BACK BY AND VERIFIED WITH MATT MARTIN 03/05/18 @ [place] Performed at [facility], [address] [place], [place] [address]   Culture, blood (routine x 2)     Status: None (Preliminary result)   Collection Time: 03/05/18  5:49 AM  Result Value Ref Range   Specimen Description BLOOD BLOOD RIGHT WRIST    Special Requests      BOTTLES DRAWN AEROBIC AND ANAEROBIC Blood Culture adequate volume   Culture      NO GROWTH < 12 HOURS Performed at [facility], [address], [place], [place] [address]    Report Status PENDING   Lactic acid, plasma     Status: Abnormal   Collection Time: 03/05/18  7:44 AM  Result Value Ref Range   Lactic Acid, Venous 2.9 (HH) 0.5 - 1.9 mmol/L    Comment: CRITICAL RESULT CALLED TO, READ BACK BY AND VERIFIED WITH ASHLEY SMITH  03/05/18  @ [place]  [place] Performed at [facility], [address] [place],  [address]   CBC with Differential     Status: Abnormal   Collection Time: 03/05/18  9:12 AM  Result Value Ref Range   WBC 8.6 3.6 - 11.0 K/uL   RBC 3.28 (L) 3.80 - 5.20 MIL/uL   Hemoglobin 12.8 12.0 - 16.0 g/dL   HCT 37.5 35.0 - 47.0 %   MCV 114.5 (H) 80.0 - 100.0 fL   MCH 39.0 (H) 26.0 - 34.0 pg   MCHC  34.1 32.0 - 36.0 g/dL   RDW 16.7 (H) 11.5 - 14.5 %   Platelets 237 150 - 440 K/uL   Neutrophils Relative % 93 %   Neutro Abs 7.9 (H) 1.4 - 6.5 K/uL   Lymphocytes Relative 3 %   Lymphs Abs 0.3 (L) 1.0 - 3.6 K/uL   Monocytes Relative 4 %   Monocytes Absolute 0.4 0.2 - 0.9 K/uL   Eosinophils Relative 0 %   Eosinophils Absolute 0.0 0 - 0.7 K/uL   Basophils Relative 0 %   Basophils Absolute 0.0 0 - 0.1 K/uL    Comment: Performed at [facility], [address] [place], [place] [address]  Basic metabolic panel     Status: Abnormal   Collection Time: 03/05/18  9:12 AM  Result Value Ref Range   Sodium 142 135 - 145 mmol/L   Potassium 2.7 (LL) 3.5 - 5.1 mmol/L    Comment: CRITICAL RESULT CALLED TO, READ BACK BY AND VERIFIED WITH ANNA RODRIGUEZ 03/05/18 @ 0936  MLK    Chloride 110 101 - 111 mmol/L   CO2 23 22 - 32 mmol/L   Glucose, Bld 116 (H) 65 - 99 mg/dL   BUN 11 6 - 20 mg/dL   Creatinine, Ser 1.28 (H) 0.44 - 1.00 mg/dL   Calcium 7.7 (L) 8.9 - 10.3 mg/dL   GFR calc non Af Amer 47 (L) >60 mL/min   GFR calc Af Amer 54 (L) >60 mL/min    Comment: (NOTE) The eGFR has been calculated using the CKD EPI equation. This calculation has not been validated in all clinical situations. eGFR's persistently <60 mL/min signify possible Chronic Kidney Disease.    Anion gap 9 5 - 15    Comment: Performed at [facility], [address] [place], [place] [address]  Hemoglobin A1c     Status: None   Collection Time: 03/05/18  9:12 AM  Result Value Ref Range   Hgb A1c MFr Bld 5.6 4.8  - 5.6 %    Comment: (NOTE) Pre diabetes:          5.7%-6.4% Diabetes:              >6.4% Glycemic control for   <7.0% adults with diabetes    Mean Plasma Glucose 114.02 mg/dL    Comment: Performed at [facility] [address] [place], [place] [address]  Magnesium     Status: Abnormal   Collection Time: 03/05/18  9:12 AM  Result Value Ref Range   Magnesium 1.4 (L) 1.7 - 2.4 mg/dL    Comment: Performed at [facility], [address] [place], [place] [address]  Glucose, capillary     Status: Abnormal   Collection Time: 03/05/18  9:21 AM  Result Value Ref Range   Glucose-Capillary 111 (H) 65 - 99 mg/dL  Glucose, capillary     Status: Abnormal   Collection Time: 03/05/18 11:34 AM  Result Value Ref Range   Glucose-Capillary 244 (H) 65 - 99 mg/dL    Current Facility-Administered Medications  Medication Dose Route Frequency Provider Last Rate Last Dose  . acetaminophen (TYLENOL) 650 MG suppository           . acetaminophen (TYLENOL) tablet 650 mg  650 mg Oral Q6H PRN Loletha Grayer, MD       Or  . acetaminophen (TYLENOL) suppository 650 mg  650 mg Rectal Q6H PRN Loletha Grayer, MD      . Derrill Memo ON 03/06/2018] aspirin EC  tablet 81 mg  81 mg Oral Daily Wieting, Richard, MD      . atorvastatin (LIPITOR) tablet 40 mg  40 mg Oral q1800 Loletha Grayer, MD      . Derrill Memo ON 03/06/2018] azithromycin (ZITHROMAX) tablet 250 mg  250 mg Oral Daily Wieting, Richard, MD      . budesonide (PULMICORT) nebulizer solution 0.5 mg  0.5 mg Nebulization BID Wieting, Richard, MD      . folic acid (FOLVITE) tablet 1 mg  1 mg Oral Daily Wieting, Richard, MD      . insulin aspart (novoLOG) injection 0-5 Units  0-5 Units Subcutaneous QHS Wieting, Richard, MD      . insulin aspart (novoLOG) injection 0-9 Units  0-9 Units Subcutaneous TID WC Loletha Grayer, MD   3 Units at 03/05/18 1244  . insulin glargine (LANTUS) injection 10 Units  10 Units Subcutaneous QHS Wieting, Richard, MD      .  ipratropium-albuterol (DUONEB) 0.5-2.5 (3) MG/3ML nebulizer solution 3 mL  3 mL Nebulization Q6H Wieting, Richard, MD      . LORazepam (ATIVAN) tablet 1 mg  1 mg Oral Q6H PRN Loletha Grayer, MD       Or  . LORazepam (ATIVAN) injection 1 mg  1 mg Intravenous Q6H PRN Wieting, Richard, MD      . magnesium sulfate IVPB 2 g 50 mL  2 g Intravenous Once Loletha Grayer, MD 50 mL/hr at 03/05/18 1433 2 g at 03/05/18 1433  . multivitamin with minerals tablet 1 tablet  1 tablet Oral Daily Wieting, Richard, MD      . nicotine (NICODERM CQ - dosed in mg/24 hours) patch 14 mg  14 mg Transdermal Daily Loletha Grayer, MD   14 mg at 03/05/18 1249  . ondansetron (ZOFRAN-ODT) disintegrating tablet 4 mg  4 mg Oral Q8H PRN Wieting, Richard, MD      . pantoprazole (PROTONIX) EC tablet 40 mg  40 mg Oral QAC breakfast Wieting, Richard, MD      . piperacillin-tazobactam (ZOSYN) IVPB 3.375 g  3.375 g Intravenous Q8H Wieting, Richard, MD      . potassium chloride 10 mEq in 100 mL IVPB  10 mEq Intravenous Q1 Hr x 3 Wieting, Richard, MD 100 mL/hr at 03/05/18 1434 10 mEq at 03/05/18 1434  . potassium chloride SA (K-DUR,KLOR-CON) CR tablet 20 mEq  20 mEq Oral BID Wieting, Richard, MD      . potassium chloride SA (K-DUR,KLOR-CON) CR tablet 40 mEq  40 mEq Oral Once Nance Pear, MD   Stopped at 03/04/18 1752  . potassium chloride SA (K-DUR,KLOR-CON) CR tablet 40 mEq  40 mEq Oral Once Loletha Grayer, MD      . thiamine (VITAMIN B-1) tablet 100 mg  100 mg Oral Daily Wieting, Richard, MD       Or  . thiamine (B-1) injection 100 mg  100 mg Intravenous Daily Loletha Grayer, MD        Musculoskeletal: Strength & Muscle Tone: within normal limits Gait & Station: normal Patient leans:   Psychiatric Specialty Exam: Physical Exam  Nursing note and vitals reviewed.   ROS  Blood pressure (!) 141/98, pulse (!) 111, temperature 99.2 F (37.3 C), temperature source Oral, resp. rate (!) 26, height 5' 5"  (1.651 m), weight  88.9 kg (196 lb), SpO2 100 %.Body mass index is 32.62 kg/m.  General Appearance: Disheveled, uncooperative, guarded,   Eye Contact:  Fair  Speech:  loud  Volume:  Increased  Mood:  Anxious  Affect:  Labile  Thought Process:  concrete  Orientation:  ox3  Thought Content:  Preoccupied with d/c   Suicidal Thoughts:  Denies at this time, but voiced it recently  Homicidal Thoughts:  No  Memory:  Short term impaired  Judgement:  Impaired  Insight:  Lacking  Psychomotor Activity:  Increased  Concentration:  poor  Recall:  [place] of Knowledge:  Good  Language:  Good  Akathisia:  No  Handed:    AIMS (if indicated):     Assets:  Housing  ADL's:  Intact  Cognition:  Poor concentration  Sleep:        Treatment Plan Summary: Pt with alcohol use disorder - moderate to severe with depression. Pt withdrawing of alcohol, poor insight about alcohol problem , not willing to accept tx for mental health. Pt not willing to discuss med options, demanding to release her now, husband concerned about her safety. Pt recently voiced suicidal statements.  Cont Alcohol withdrawal protocol.  Cont 1:1 until pt more cooperative.   May need to re  IVC if pt demands to be released without safe d/c plan. Call with questions    Lenward Chancellor, MD 03/05/2018 3:18 PM

## 2018-03-05 NOTE — ED Notes (Signed)
Pt. Woke up from room asking about her money she thought was in her bra and jewelry she was wearing when she was brought into hospital.  Pt. Had $1660.00 in cash in purse and jewelry.  Pt. Confirmed amount of cash and jewelry, and it was given to security.

## 2018-03-05 NOTE — Progress Notes (Signed)
Pts potasium 2.7 MD [place] notified. MD placed tele orders. Tele applied on pt.

## 2018-03-05 NOTE — ED Notes (Signed)
Pt. Completed SOC. 

## 2018-03-05 NOTE — ED Notes (Signed)
Belongings key removed from ED pyxis and tubed to [facility] to 1A. 1540 ana called and stated that she received the key and placed on the patient's chart

## 2018-03-06 ENCOUNTER — Other Ambulatory Visit: Payer: Self-pay

## 2018-03-06 LAB — BASIC METABOLIC PANEL
Anion gap: 12 (ref 5–15)
BUN: 14 mg/dL (ref 6–20)
CHLORIDE: 108 mmol/L (ref 101–111)
CO2: 21 mmol/L — ABNORMAL LOW (ref 22–32)
CREATININE: 1.06 mg/dL — AB (ref 0.44–1.00)
Calcium: 7.7 mg/dL — ABNORMAL LOW (ref 8.9–10.3)
GFR calc Af Amer: >60 mL/min (ref >60)
GFR, EST NON AFRICAN AMERICAN: 58 mL/min — AB (ref >60)
GLUCOSE: 95 mg/dL (ref 65–99)
POTASSIUM: 2.8 mmol/L — AB (ref 3.5–5.1)
SODIUM: 141 mmol/L (ref 135–145)

## 2018-03-06 LAB — CBC
HEMATOCRIT: 33.5 % — AB (ref 35.0–47.0)
Hemoglobin: 11.3 g/dL — ABNORMAL LOW (ref 12.0–16.0)
MCH: 38.7 pg — ABNORMAL HIGH (ref 26.0–34.0)
MCHC: 33.6 g/dL (ref 32.0–36.0)
MCV: 115.1 fL — AB (ref 80.0–100.0)
PLATELETS: 193 10*3/uL (ref 150–440)
RBC: 2.91 MIL/uL — ABNORMAL LOW (ref 3.80–5.20)
RDW: 16.1 % — AB (ref 11.5–14.5)
WBC: 9.9 10*3/uL (ref 3.6–11.0)

## 2018-03-06 LAB — GLUCOSE, CAPILLARY: Glucose-Capillary: 104 mg/dL — ABNORMAL HIGH (ref 65–99)

## 2018-03-06 LAB — MAGNESIUM: Magnesium: 1.8 mg/dL (ref 1.7–2.4)

## 2018-03-06 MED ORDER — POTASSIUM CHLORIDE CRYS ER 20 MEQ PO TBCR: 20.0000 meq | EXTENDED_RELEASE_TABLET | Freq: Two times a day (BID) | ORAL | 0 refills | Status: DC

## 2018-03-06 MED ORDER — POTASSIUM CHLORIDE CRYS ER 20 MEQ PO TBCR
40.0000 meq | EXTENDED_RELEASE_TABLET | Freq: Two times a day (BID) | ORAL | Status: DC
  Administered 2018-03-06: 40 meq via ORAL
  Filled 2018-03-06: qty 2

## 2018-03-06 MED ORDER — AMOXICILLIN-POT CLAVULANATE 875-125 MG PO TABS: 1.0000 | ORAL_TABLET | Freq: Two times a day (BID) | ORAL | 0 refills | Status: DC

## 2018-03-06 MED ORDER — AMOXICILLIN-POT CLAVULANATE 875-125 MG PO TABS: 1.0000 | ORAL_TABLET | Freq: Two times a day (BID) | ORAL | Status: DC

## 2018-03-06 MED ORDER — LISINOPRIL 10 MG PO TABS
10.0000 mg | ORAL_TABLET | Freq: Every day | ORAL | Status: DC
  Administered 2018-03-06: 10 mg via ORAL
  Filled 2018-03-06: qty 1

## 2018-03-06 MED ORDER — MAGNESIUM SULFATE 2 GM/50ML IV SOLN
2.0000 g | Freq: Once | INTRAVENOUS | Status: AC
  Administered 2018-03-06: 2 g via INTRAVENOUS
  Filled 2018-03-06: qty 50

## 2018-03-06 MED ORDER — MAGNESIUM OXIDE 240 MG PO PACK: 400.0000 mg | PACK | Freq: Two times a day (BID) | ORAL | 0 refills | Status: DC

## 2018-03-06 MED ORDER — ORAL CARE MOUTH RINSE: 15.0000 mL | Freq: Two times a day (BID) | OROMUCOSAL | Status: DC

## 2018-03-06 MED ORDER — PNEUMOCOCCAL VAC POLYVALENT 25 MCG/0.5ML IJ INJ: 0.5000 mL | INJECTION | INTRAMUSCULAR | Status: DC

## 2018-03-06 MED ORDER — POTASSIUM CHLORIDE IN NACL 20-0.9 MEQ/L-% IV SOLN
INTRAVENOUS | Status: DC
  Administered 2018-03-06: 09:00:00 via INTRAVENOUS
  Filled 2018-03-06: qty 1000

## 2018-03-06 NOTE — Discharge Summary (Signed)
[place] [facility] [place] NAME: Aimee Buck    MR#:  [id number]  DATE OF BIRTH:  1961-02-04  DATE OF ADMISSION:  03/04/2018 ADMITTING PHYSICIAN: Loletha Grayer, MD  DATE OF signing out [place]: 03/06/2018 10:05 AM  PRIMARY CARE PHYSICIAN: Gennette Pac, FNP    ADMISSION DIAGNOSIS:  Alcohol abuse [F10.10] Sepsis, due to unspecified organism ([place]) [A41.9] Hypertension, unspecified type [I10] Community acquired pneumonia, unspecified laterality [J18.9]  DISCHARGE DIAGNOSIS:  Active Problems:   Pneumonia   Alcohol abuse   Depression   SECONDARY DIAGNOSIS:   Past Medical History:  Diagnosis Date  . Asthma   . COPD (chronic obstructive pulmonary disease) ([place])   . Diabetes ([place])   . Hypertension     HOSPITAL COURSE:   1.  Clinical sepsis with aspiration pneumonia tachypnea tachycardia and fever with acute encephalopathy.  The patient's acute encephalopathy had improved.  And the patient wanted to sign out [place].  I did give her an Augmentin prescription to go home with.  2.  Severe hypokalemia and hypomagnesemia.  I did prescribe both potassium and magnesium while she was in the hospital and upon discharge.  I explained that she can get arrhythmias and death.  She stated that she will eat a banana.  Patient signed out [place]. 3.  Alcohol abuse and depression.  Since the patient wanted to sign out AGAINST MEDICAL ADVICE I asked Dr. Weber Cooks to come by and see the patient prior to her signing out [place].  He stated there was no indication to keep her here in the hospital against her will. 4.  Essential hypertension can restart her blood pressure medications 5.  Hyperlipidemia unspecified on atorvastatin 6.  COPD on nebulizer treatments here 7.  Tobacco abuse on nicotine patch while in the hospital probably will go back to smoking upon going home  DISCHARGE CONDITIONS:    Patient signed out [place].  Risk of arrhythmia and death with hypokalemia and hypomagnesemia.  Patient was admitted with sepsis and pneumonia risk of death explained.  CONSULTS OBTAINED:  Treatment Team:  Lenward Chancellor, MD [place], Madie Reno, MD  DRUG ALLERGIES:   Allergies  Allergen Reactions  . Metrizamide Itching and Swelling    Previous contrast allergy as a young adult Previous contrast allergy as a young adult  . Contrast Media [Iodinated Diagnostic Agents] Itching and Swelling    Previous contrast allergy as a young adult    DISCHARGE MEDICATIONS:   Allergies as of 03/06/2018      Reactions   Metrizamide Itching, Swelling   Previous contrast allergy as a young adult Previous contrast allergy as a young adult   Contrast Media [iodinated Diagnostic Agents] Itching, Swelling   Previous contrast allergy as a young adult      Medication List    TAKE these medications   amoxicillin-clavulanate 875-125 MG tablet Commonly known as:  AUGMENTIN Take 1 tablet by mouth every 12 (twelve) hours.   aspirin EC 81 MG tablet Take 81 mg by mouth daily.   atorvastatin 40 MG tablet Commonly known as:  LIPITOR Take 1 tablet (40 mg total) by mouth daily at 6 PM.   Fluticasone-Salmeterol 100-50 MCG/DOSE Aepb Commonly known as:  ADVAIR Inhale 1 puff into the lungs 2 (two) times daily.   gabapentin 100 MG capsule Commonly known as:  NEURONTIN Take 2 capsules (200 mg total) by mouth 2 (two) times daily.   insulin  aspart 100 UNIT/ML injection Commonly known as:  novoLOG Inject 15 Units into the skin 3 (three) times daily with meals.   insulin glargine 100 UNIT/ML injection Commonly known as:  LANTUS Inject 0.3 mLs (30 Units total) into the skin 2 (two) times daily. What changed:    how much to take  when to take this   lisinopril 10 MG tablet Commonly known as:  PRINIVIL,ZESTRIL Take 10 mg by mouth daily.   loperamide 2 MG tablet Commonly known as:   IMODIUM A-D Take 2 tablets (4 mg total) by mouth 4 (four) times daily as needed for diarrhea or loose stools.   Magnesium Oxide 240 MG Pack Commonly known as:  MAGNESIUM OXIDE 400 Take 400 mg by mouth 2 (two) times daily.   naproxen 500 MG tablet Commonly known as:  NAPROSYN Take 1 tablet (500 mg total) by mouth 2 (two) times daily with a meal.   omeprazole 40 MG capsule Commonly known as:  PRILOSEC Take 40 mg by mouth daily.   ondansetron 4 MG disintegrating tablet Commonly known as:  ZOFRAN ODT Take 1 tablet (4 mg total) by mouth every 8 (eight) hours as needed for nausea or vomiting.   potassium chloride SA 20 MEQ tablet Commonly known as:  K-DUR,KLOR-CON Take 1 tablet (20 mEq total) by mouth 2 (two) times daily.   traMADol 50 MG tablet Commonly known as:  ULTRAM Take 50 mg by mouth every 6 (six) hours as needed.        DISCHARGE INSTRUCTIONS:   None since she signed out [place]  Today   CHIEF COMPLAINT:   Chief Complaint  Patient presents with  . Psychiatric Evaluation    HISTORY OF PRESENT ILLNESS:  Aimee Buck  is a 57 y.o. female came in because she was drinking and was admitted because she had a pneumonia   VITAL SIGNS:  Blood pressure (!) 143/100, pulse (!) 101, temperature 97.8 F (36.6 C), temperature source Oral, resp. rate 18, height 5\' 5"  (1.651 m), weight 88.9 kg (196 lb), SpO2 97 %.    DATA REVIEW:   CBC Recent Labs  Lab 03/06/18 0336  WBC 9.9  HGB 11.3*  HCT 33.5*  PLT 193    Chemistries  Recent Labs  Lab 03/04/18 1544  03/06/18 0336  NA 144   < > 141  K 2.8*   < > 2.8*  CL 108   < > 108  CO2 26   < > 21*  GLUCOSE 112*   < > 95  BUN 8   < > 14  CREATININE 1.13*   < > 1.06*  CALCIUM 8.7*   < > 7.7*  MG  --    < > 1.8  AST 28  --   --   ALT 16  --   --   ALKPHOS 106  --   --   BILITOT 0.5  --   --    < > = values in this interval not displayed.     Microbiology Results  Results for orders  placed or performed during the hospital encounter of 03/04/18  Culture, blood (routine x 2)     Status: None (Preliminary result)   Collection Time: 03/05/18  5:47 AM  Result Value Ref Range Status   Specimen Description BLOOD BLOOD RIGHT ARM  Final   Special Requests   Final    BOTTLES DRAWN AEROBIC AND ANAEROBIC Blood Culture results may not be optimal due to an  excessive volume of blood received in culture bottles   Culture   Final    NO GROWTH 1 DAY Performed at [facility], [address] [place], [place] [address]    Report Status PENDING  Incomplete  Culture, blood (routine x 2)     Status: None (Preliminary result)   Collection Time: 03/05/18  5:49 AM  Result Value Ref Range Status   Specimen Description BLOOD BLOOD RIGHT WRIST  Final   Special Requests   Final    BOTTLES DRAWN AEROBIC AND ANAEROBIC Blood Culture adequate volume   Culture   Final    NO GROWTH 1 DAY Performed at [facility], [address], [place], [place] [address]    Report Status PENDING  Incomplete    RADIOLOGY:  Dg Chest Portable 1 View  Result Date: 03/05/2018 CLINICAL DATA:  Shortness of breath. EXAM: PORTABLE CHEST 1 VIEW COMPARISON:  06/03/2011 FINDINGS: Mild cardiomegaly. Normal mediastinal contours. Bilateral perihilar opacities, left greater than right. Ill-defined bibasilar opacities, also left greater than right. No pneumothorax. IMPRESSION: Mild cardiomegaly. Perihilar and bibasilar opacities, left greater than right may be pulmonary edema, multifocal pneumonia, atelectasis or combination thereof. Electronically Signed   By: Jeb Levering M.D.   On: 03/05/2018 05:28     Management plans discussed with the patient, and she told me she was leaving [place].  CODE STATUS:  Code Status History    Date Active Date Inactive Code Status Order ID Comments User Context   03/05/2018 07:45 03/06/2018 13:12 Full Code [id number]  Loletha Grayer, MD ED   03/09/2017  13:28 03/11/2017 13:19 Full Code [id number]  Hillary Bow, MD ED   10/14/2016 19:09 10/16/2016 18:14 Full Code [id number]  Gladstone Lighter, MD Inpatient      TOTAL TIME TAKING CARE OF THIS PATIENT: 35 minutes.    Loletha Grayer M.D on 03/06/2018 at 3:08 PM  Between 7am to 6pm - Pager - [phone]  After 6pm go to [email or web address] - password [email or web address]  [facility] Office  [phone]  CC: Primary care physician; Gennette Pac, [place]

## 2018-03-06 NOTE — Consult Note (Signed)
Psychiatry: Full note to follow. Patient seen and chart reviewed. Spoke with Dr Leslye Peer. Patient does not meet commitment criteria. No rationale for forcing her to stay if she wants to leave AMA.

## 2018-03-06 NOTE — Progress Notes (Signed)
Pt leaving AMA/ MD aware/ RX given to pt and belongings returned to pt by security/ iv and tele removed/ transported off unit via wheelchair

## 2018-03-06 NOTE — Progress Notes (Signed)
Patient ID: Aimee Buck, female   DOB: July 25, 1961, 57 y.o.   MRN: [id number]   [facility] PROGRESS NOTE  Aimee Buck OVF:[id number] DOB: 1961/02/23 DOA: 03/04/2018 PCP: Gennette Pac, FNP  HPI/Subjective: Patient states that she needs to get out of here today.  She states that she is going to leave [place].  She states she feels much better today.  Her breathing is fine.  I explained to her that her electrolytes are still very low with the potassium and magnesium and this can cause arrhythmia and death.  I explained that she developed pneumonia by being in the ER.  She states that she is feeling fine  And that she has to leave.  I stated yesterday that she said she started drinking because she wanted to hurt herself.  She says that is not the case.  She needs to help out other people and that is why she has to go home today  Objective: Vitals:   03/06/18 0606 03/06/18 0814  BP: (!) 162/109   Pulse: (!) 104   Resp:    Temp:    SpO2: 100% 100%    Intake/Output Summary (Last 24 hours) at 03/06/2018 0844 Last data filed at 03/06/2018 0300 Gross per 24 hour  Intake 340 ml  Output -  Net 340 ml   Filed Weights   03/04/18 1541  Weight: 88.9 kg (196 lb)    ROS: Review of Systems  Unable to perform ROS: Acuity of condition  Constitutional: Negative for chills and fever.  Eyes: Negative for blurred vision.  Respiratory: Negative for cough and shortness of breath.   Cardiovascular: Negative for chest pain.  Gastrointestinal: Negative for abdominal pain, constipation, diarrhea, nausea and vomiting.  Genitourinary: Negative for dysuria.  Musculoskeletal: Negative for joint pain.  Neurological: Negative for dizziness and headaches.   Exam: Physical Exam  Constitutional: She is oriented to person, place, and time.  HENT:  Nose: No mucosal edema.  Mouth/Throat: No oropharyngeal exudate or posterior oropharyngeal edema.  Eyes: Conjunctivae, EOM and  lids are normal. Pupils are equal, round, and reactive to light.  Neck: No JVD present. Carotid bruit is not present. No edema present. No thyroid mass and no thyromegaly present.  Cardiovascular: S1 normal and S2 normal. Tachycardia present. Exam reveals no gallop.  No murmur heard. Pulses:      Dorsalis pedis pulses are 2+ on the right side, and 2+ on the left side.  Respiratory: No respiratory distress. She has decreased breath sounds in the right lower field and the left lower field. She has no wheezes. She has rhonchi in the right lower field. She has no rales.  GI: Soft. Bowel sounds are normal. There is no tenderness.  Musculoskeletal:       Right ankle: She exhibits swelling.       Left ankle: She exhibits swelling.  Lymphadenopathy:    She has no cervical adenopathy.  Neurological: She is alert and oriented to person, place, and time. No cranial nerve deficit.  Skin: Skin is warm. No rash noted. Nails show no clubbing.  Psychiatric: Her mood appears anxious.      Data Reviewed: Basic Metabolic Panel: Recent Labs  Lab 03/04/18 1544 03/05/18 0912 03/06/18 0336  NA 144 142 141  K 2.8* 2.7* 2.8*  CL 108 110 108  CO2 26 23 21*  GLUCOSE 112* 116* 95  BUN 8 11 14   CREATININE 1.13* 1.28* 1.06*  CALCIUM 8.7* 7.7* 7.7*  MG  --  1.4* 1.8   Liver Function Tests: Recent Labs  Lab 03/04/18 1544  AST 28  ALT 16  ALKPHOS 106  BILITOT 0.5  PROT 7.1  ALBUMIN 3.4*   CBC: Recent Labs  Lab 03/04/18 1544 03/05/18 0912 03/06/18 0336  WBC 4.0 8.6 9.9  NEUTROABS  --  7.9*  --   HGB 13.2 12.8 11.3*  HCT 39.3 37.5 33.5*  MCV 114.4* 114.5* 115.1*  PLT 273 237 193    CBG: Recent Labs  Lab 03/05/18 0921 03/05/18 1134 03/05/18 1727 03/05/18 2110 03/06/18 0746  GLUCAP 111* 244* 116* 104* 104*    Recent Results (from the past 240 hour(s))  Culture, blood (routine x 2)     Status: None (Preliminary result)   Collection Time: 03/05/18  5:47 AM  Result Value Ref Range  Status   Specimen Description BLOOD BLOOD RIGHT ARM  Final   Special Requests   Final    BOTTLES DRAWN AEROBIC AND ANAEROBIC Blood Culture results may not be optimal due to an excessive volume of blood received in culture bottles   Culture   Final    NO GROWTH 1 DAY Performed at [facility], [address], [place], [place] [address]    Report Status PENDING  Incomplete  Culture, blood (routine x 2)     Status: None (Preliminary result)   Collection Time: 03/05/18  5:49 AM  Result Value Ref Range Status   Specimen Description BLOOD BLOOD RIGHT WRIST  Final   Special Requests   Final    BOTTLES DRAWN AEROBIC AND ANAEROBIC Blood Culture adequate volume   Culture   Final    NO GROWTH 1 DAY Performed at [facility], [address], [place], [place] [address]    Report Status PENDING  Incomplete     Studies: Dg Chest Portable 1 View  Result Date: 03/05/2018 CLINICAL DATA:  Shortness of breath. EXAM: PORTABLE CHEST 1 VIEW COMPARISON:  06/03/2011 FINDINGS: Mild cardiomegaly. Normal mediastinal contours. Bilateral perihilar opacities, left greater than right. Ill-defined bibasilar opacities, also left greater than right. No pneumothorax. IMPRESSION: Mild cardiomegaly. Perihilar and bibasilar opacities, left greater than right may be pulmonary edema, multifocal pneumonia, atelectasis or combination thereof. Electronically Signed   By: Jeb Levering M.D.   On: 03/05/2018 05:28    Scheduled Meds: . amoxicillin-clavulanate  1 tablet Oral Q12H  . aspirin EC  81 mg Oral Daily  . atorvastatin  40 mg Oral q1800  . azithromycin  250 mg Oral Daily  . budesonide (PULMICORT) nebulizer solution  0.5 mg Nebulization BID  . folic acid  1 mg Oral Daily  . insulin aspart  0-5 Units Subcutaneous QHS  . insulin aspart  0-9 Units Subcutaneous TID WC  . insulin glargine  10 Units Subcutaneous QHS  . ipratropium-albuterol  3 mL Nebulization Q6H  . mouth rinse  15 mL Mouth Rinse BID   . multivitamin with minerals  1 tablet Oral Daily  . nicotine  14 mg Transdermal Daily  . pantoprazole  40 mg Oral QAC breakfast  . [START ON 03/07/2018] pneumococcal 23 valent vaccine  0.5 mL Intramuscular Tomorrow-1000  . potassium chloride  40 mEq Oral Once  . potassium chloride  40 mEq Oral Once  . potassium chloride  40 mEq Oral BID  . thiamine  100 mg Oral Daily   Or  . thiamine  100 mg Intravenous Daily   Continuous Infusions: . 0.9 % NaCl with KCl 20 mEq / L    .  magnesium sulfate 1 - 4 g bolus IVPB    . piperacillin-tazobactam (ZOSYN)  IV 3.375 g (03/06/18 7035)    Assessment/Plan:  1. Clinical sepsis with aspiration pneumonia, tachypnea, tachycardia and fever with acute encephalopathy.  Acute encephalopathy has improved.  Patient no longer having fever.  I explained to the patient that she has pneumonia and I would like to watch her at least another day.  Patient states that she has to leave today.  If she signs out AGAINST MEDICAL ADVICE Augmentin prescribed upon leaving AMA.  2. Hypokalemia and hypomagnesemia.  Try to replace IV magnesium and potassium today prior to signing out [employer].  I explained to her that electrolyte abnormalities can cause arrhythmias and death.  The patient states that she will eat a banana at home. 3. Alcohol abuse.  Depression.  Patient was seen by psychiatry yesterday who remove the IVC.  I spoke with Dr. Carlena Hurl psychiatry today about whether or not we need to commit her or not.  He can evaluate  the patient in around 40 minutes. since the patient does not have thoughts of hurting herself or other people at this point, we do not have an indication for IVC at this point.  I asked the nurse to try to hang the magnesium prior to her signing out [place]MA to delay the patient from leaving until Dr. Weber Cooks is able to do his evaluation. 4. Essential hypertension.  Restart her BP meds 5. Hyperlipidemia unspecified on atorvastatin 6. COPD on  nebulizer treatments here 7. Tobacco abuse on nicotine patch  Code Status:     Code Status Orders  (From admission, onward)        Start     Ordered   03/05/18 0746  Full code  Continuous     03/05/18 0745    Code Status History    Date Active Date Inactive Code Status Order ID Comments User Context   03/09/2017 13:28 03/11/2017 13:19 Full Code [id number]  Hillary Bow, MD ED   10/14/2016 19:09 10/16/2016 18:14 Full Code [id number]  Gladstone Lighter, MD Inpatient     Disposition Plan: Patient will likely sign out [place]  Consultants:  Psychiatry  Antibiotics:  Zosyn while here Augmentin after signing out  Time spent: 35 minutes  [place]

## 2018-03-07 LAB — HIV ANTIBODY (ROUTINE TESTING W REFLEX): HIV SCREEN 4TH GENERATION: NONREACTIVE

## 2018-03-10 LAB — CULTURE, BLOOD (ROUTINE X 2)
Culture: NO GROWTH
Culture: NO GROWTH
Special Requests: ADEQUATE

## 2018-09-12 ENCOUNTER — Other Ambulatory Visit: Payer: Self-pay | Admitting: Family Medicine

## 2018-09-12 DIAGNOSIS — Z1231 Encounter for screening mammogram for malignant neoplasm of breast: Secondary | ICD-10-CM

## 2018-09-29 ENCOUNTER — Ambulatory Visit
Admission: RE | Admit: 2018-09-29 | Discharge: 2018-09-29 | Disposition: A | Discharge status: Home / Self Care | Payer: Medicaid Other | Source: Ambulatory Visit | Attending: Family Medicine | Admitting: Family Medicine

## 2018-09-29 ENCOUNTER — Other Ambulatory Visit: Payer: Self-pay

## 2018-09-29 DIAGNOSIS — Z1231 Encounter for screening mammogram for malignant neoplasm of breast: Secondary | ICD-10-CM | POA: Diagnosis not present

## 2018-10-03 ENCOUNTER — Other Ambulatory Visit: Payer: Self-pay | Admitting: Family Medicine

## 2018-10-03 DIAGNOSIS — R928 Other abnormal and inconclusive findings on diagnostic imaging of breast: Secondary | ICD-10-CM

## 2018-10-03 DIAGNOSIS — N6310 Unspecified lump in the right breast, unspecified quadrant: Secondary | ICD-10-CM

## 2018-10-11 ENCOUNTER — Ambulatory Visit
Admission: RE | Admit: 2018-10-11 | Discharge: 2018-10-11 | Disposition: A | Discharge status: Home / Self Care | Payer: Medicaid Other | Source: Ambulatory Visit | Attending: Family Medicine | Admitting: Family Medicine

## 2018-10-11 DIAGNOSIS — N6310 Unspecified lump in the right breast, unspecified quadrant: Secondary | ICD-10-CM

## 2018-10-11 DIAGNOSIS — R928 Other abnormal and inconclusive findings on diagnostic imaging of breast: Secondary | ICD-10-CM | POA: Insufficient documentation

## 2018-10-11 DIAGNOSIS — N6314 Unspecified lump in the right breast, lower inner quadrant: Secondary | ICD-10-CM | POA: Diagnosis not present

## 2018-10-12 ENCOUNTER — Inpatient Hospital Stay: Admit: 2018-10-12 | Payer: Self-pay | Admitting: Surgery

## 2018-10-12 SURGERY — ARTHROPLASTY, KNEE, BILATERAL, TOTAL
Anesthesia: Choice | Laterality: Bilateral

## 2018-10-20 HISTORY — PX: JOINT REPLACEMENT: SHX530

## 2018-11-15 ENCOUNTER — Other Ambulatory Visit: Payer: Self-pay

## 2018-11-15 ENCOUNTER — Encounter
Admission: RE | Admit: 2018-11-15 | Discharge: 2018-11-15 | Disposition: A | Discharge status: Home / Self Care | Payer: Medicaid Other | Source: Ambulatory Visit | Attending: Surgery | Admitting: Surgery

## 2018-11-15 ENCOUNTER — Ambulatory Visit
Admission: RE | Admit: 2018-11-15 | Discharge: 2018-11-15 | Disposition: A | Discharge status: Home / Self Care | Payer: Medicaid Other | Source: Ambulatory Visit | Attending: Surgery | Admitting: Surgery

## 2018-11-15 DIAGNOSIS — Z01810 Encounter for preprocedural cardiovascular examination: Secondary | ICD-10-CM

## 2018-11-15 DIAGNOSIS — Z01818 Encounter for other preprocedural examination: Secondary | ICD-10-CM

## 2018-11-15 HISTORY — DX: Gastro-esophageal reflux disease without esophagitis: K21.9

## 2018-11-15 LAB — TYPE AND SCREEN
ABO/RH(D): O POS
Antibody Screen: NEGATIVE

## 2018-11-15 LAB — BASIC METABOLIC PANEL
Anion gap: 8 (ref 5–15)
BUN: 13 mg/dL (ref 6–20)
CALCIUM: 8.9 mg/dL (ref 8.9–10.3)
CHLORIDE: 106 mmol/L (ref 98–111)
CO2: 26 mmol/L (ref 22–32)
CREATININE: 1.49 mg/dL — AB (ref 0.44–1.00)
GFR calc non Af Amer: 39 mL/min — ABNORMAL LOW (ref >60)
GFR, EST AFRICAN AMERICAN: 45 mL/min — AB (ref >60)
Glucose, Bld: 107 mg/dL — ABNORMAL HIGH (ref 70–99)
Potassium: 3.3 mmol/L — ABNORMAL LOW (ref 3.5–5.1)
SODIUM: 140 mmol/L (ref 135–145)

## 2018-11-15 LAB — CBC
HCT: 42.4 % (ref 36.0–46.0)
Hemoglobin: 14.1 g/dL (ref 12.0–15.0)
MCH: 36.6 pg — AB (ref 26.0–34.0)
MCHC: 33.3 g/dL (ref 30.0–36.0)
MCV: 110.1 fL — ABNORMAL HIGH (ref 80.0–100.0)
PLATELETS: 226 10*3/uL (ref 150–400)
RBC: 3.85 MIL/uL — ABNORMAL LOW (ref 3.87–5.11)
RDW: 15.2 % (ref 11.5–15.5)
WBC: 5.1 10*3/uL (ref 4.0–10.5)
nRBC: 0 % (ref 0.0–0.2)

## 2018-11-15 LAB — URINALYSIS, ROUTINE W REFLEX MICROSCOPIC
BACTERIA UA: NONE SEEN
Bilirubin Urine: NEGATIVE
Glucose, UA: NEGATIVE mg/dL
Hgb urine dipstick: NEGATIVE
KETONES UR: NEGATIVE mg/dL
Leukocytes, UA: NEGATIVE
Nitrite: NEGATIVE
PH: 5 (ref 5.0–8.0)
Protein, ur: 30 mg/dL — AB
Specific Gravity, Urine: 1.026 (ref 1.005–1.030)
WBC UA: NONE SEEN WBC/hpf (ref 0–5)

## 2018-11-15 LAB — SURGICAL PCR SCREEN
MRSA, PCR: NEGATIVE
Staphylococcus aureus: NEGATIVE

## 2018-11-15 NOTE — Pre-Procedure Instructions (Signed)
LABS WITH KT 3.3 FAXED TO DR Breathitt / NEEDS SUPPL STARTED. RECHECK KT AM SURGERY. OTHER ABN LABS ALSO FAXED

## 2018-11-15 NOTE — Patient Instructions (Signed)
Your procedure is scheduled on: Tuesday 11/28/18 Report to [facility]. To find out your arrival time please call [phone] between 1PM - 3PM on Monday 11/27/18.  Remember: Instructions that are not followed completely may result in serious medical risk, up to and including death, or upon the discretion of your surgeon and anesthesiologist your surgery may need to be rescheduled.     _X__ 1. Do not eat food after midnight the night before your procedure.                 No gum chewing or hard candies. You may drink clear liquids up to 2 hours                 before you are scheduled to arrive for your surgery- DO not drink clear                 liquids within 2 hours of the start of your surgery.                 Clear Liquids include:  water, apple juice without pulp, clear carbohydrate                 drink such as Clearfast or Gatorade, Black Coffee or Tea (Do not add                 anything to coffee or tea).  __X__2.  On the morning of surgery brush your teeth with toothpaste and water, you                 may rinse your mouth with mouthwash if you wish.  Do not swallow any              toothpaste of mouthwash.     _X__ 3.  No Alcohol for 24 hours before or after surgery.   _X__ 4.  Do Not Smoke or use e-cigarettes For 24 Hours Prior to Your Surgery.                 Do not use any chewable tobacco products for at least 6 hours prior to                 surgery.  ____  5.  Bring all medications with you on the day of surgery if instructed.   __X__  6.  Notify your doctor if there is any change in your medical condition      (cold, fever, infections).     Do not wear jewelry, make-up, hairpins, clips or nail polish. Do not wear lotions, powders, or perfumes.  Do not shave 48 hours prior to surgery. Men may shave face and neck. Do not bring valuables to the hospital.    [facility] is not responsible for any belongings or  valuables.  Contacts, dentures/partials or body piercings may not be worn into surgery. Bring a case for your contacts, glasses or hearing aids, a denture cup will be supplied. Leave your suitcase in the car. After surgery it may be brought to your room. For patients admitted to the hospital, discharge time is determined by your treatment team.   Patients discharged the day of surgery will not be allowed to drive home.   Please read over the following fact sheets that you were given:   MRSA Information  __X__ Take these medicines the morning of surgery with A SIP OF WATER:  1. omeprazole (PRILOSEC)   2.   3.   4.  5.  6.  ____ Fleet Enema (as directed)   __X__ Use CHG Soap/SAGE wipes as directed  __X__ Use inhalers on the day of surgery  ____ Stop metformin/Janumet/Farxiga 2 days prior to surgery    __X__ Take 1/2 of usual insulin dose the night before surgery. No insulin the morning          of surgery.   ____ Stop Blood Thinners Coumadin/Plavix/Xarelto/Pleta/Pradaxa/Eliquis/Effient/Aspirin  on   Or contact your Surgeon, Cardiologist or Medical Doctor regarding  ability to stop your blood thinners  __X__ Stop Anti-inflammatories 7 days before surgery such as Advil, Ibuprofen, Motrin,  BC or Goodies Powder, Naprosyn, Naproxen, Aleve, Aspirin MAY TAKE TYLENOL IF NEEDED   __X__ Stop all herbal supplements, fish oil or vitamin E until after surgery.    ____ Bring C-Pap to the hospital.

## 2018-11-16 LAB — URINE CULTURE

## 2018-11-20 NOTE — Pre-Procedure Instructions (Signed)
UC FAXED TO DR Roland Rack

## 2018-11-28 ENCOUNTER — Inpatient Hospital Stay: Payer: Medicaid Other | Admitting: Anesthesiology

## 2018-11-28 ENCOUNTER — Inpatient Hospital Stay: Payer: Medicaid Other

## 2018-11-28 ENCOUNTER — Encounter
Admission: RE | Disposition: A | Discharge status: Home Health | Payer: Self-pay | Source: Ambulatory Visit | Attending: Surgery

## 2018-11-28 ENCOUNTER — Inpatient Hospital Stay
Admission: RE | Admit: 2018-11-28 | Discharge: 2018-11-30 | DRG: 470 | Disposition: A | Discharge status: Home Health | Payer: Medicaid Other | Source: Ambulatory Visit | Attending: Surgery | Admitting: Surgery

## 2018-11-28 ENCOUNTER — Other Ambulatory Visit: Payer: Self-pay

## 2018-11-28 ENCOUNTER — Encounter: Payer: Self-pay | Admitting: *Deleted

## 2018-11-28 DIAGNOSIS — F1010 Alcohol abuse, uncomplicated: Secondary | ICD-10-CM | POA: Diagnosis present

## 2018-11-28 DIAGNOSIS — E119 Type 2 diabetes mellitus without complications: Secondary | ICD-10-CM | POA: Diagnosis present

## 2018-11-28 DIAGNOSIS — F17210 Nicotine dependence, cigarettes, uncomplicated: Secondary | ICD-10-CM | POA: Diagnosis present

## 2018-11-28 DIAGNOSIS — Z79899 Other long term (current) drug therapy: Secondary | ICD-10-CM

## 2018-11-28 DIAGNOSIS — E785 Hyperlipidemia, unspecified: Secondary | ICD-10-CM | POA: Diagnosis present

## 2018-11-28 DIAGNOSIS — M1712 Unilateral primary osteoarthritis, left knee: Secondary | ICD-10-CM | POA: Diagnosis present

## 2018-11-28 DIAGNOSIS — Z6841 Body Mass Index (BMI) 40.0 and over, adult: Secondary | ICD-10-CM

## 2018-11-28 DIAGNOSIS — K219 Gastro-esophageal reflux disease without esophagitis: Secondary | ICD-10-CM | POA: Diagnosis present

## 2018-11-28 DIAGNOSIS — I10 Essential (primary) hypertension: Secondary | ICD-10-CM | POA: Diagnosis present

## 2018-11-28 DIAGNOSIS — R000 Tachycardia, unspecified: Secondary | ICD-10-CM | POA: Diagnosis not present

## 2018-11-28 DIAGNOSIS — J449 Chronic obstructive pulmonary disease, unspecified: Secondary | ICD-10-CM | POA: Diagnosis present

## 2018-11-28 DIAGNOSIS — Z96652 Presence of left artificial knee joint: Secondary | ICD-10-CM

## 2018-11-28 HISTORY — PX: TOTAL KNEE ARTHROPLASTY: SHX125

## 2018-11-28 LAB — POCT I-STAT 4, (NA,K, GLUC, HGB,HCT)
Glucose, Bld: 111 mg/dL — ABNORMAL HIGH (ref 70–99)
HCT: 42 % (ref 36.0–46.0)
Hemoglobin: 14.3 g/dL (ref 12.0–15.0)
Potassium: 3.9 mmol/L (ref 3.5–5.1)
Sodium: 140 mmol/L (ref 135–145)

## 2018-11-28 LAB — GLUCOSE, CAPILLARY
GLUCOSE-CAPILLARY: 104 mg/dL — AB (ref 70–99)
Glucose-Capillary: 104 mg/dL — ABNORMAL HIGH (ref 70–99)

## 2018-11-28 LAB — ABO/RH: ABO/RH(D): O POS

## 2018-11-28 SURGERY — ARTHROPLASTY, KNEE, TOTAL
Anesthesia: General | Site: Knee | Laterality: Left

## 2018-11-28 MED ORDER — SODIUM CHLORIDE 0.9 % IV SOLN
INTRAVENOUS | Status: DC
  Administered 2018-11-28 (×2): via INTRAVENOUS

## 2018-11-28 MED ORDER — PROPOFOL 500 MG/50ML IV EMUL
INTRAVENOUS | Status: AC
  Filled 2018-11-28: qty 50

## 2018-11-28 MED ORDER — ACETAMINOPHEN 10 MG/ML IV SOLN
INTRAVENOUS | Status: DC | PRN
  Administered 2018-11-28: 1000 mg via INTRAVENOUS

## 2018-11-28 MED ORDER — CEFAZOLIN SODIUM-DEXTROSE 2-4 GM/100ML-% IV SOLN
INTRAVENOUS | Status: AC
  Filled 2018-11-28: qty 100

## 2018-11-28 MED ORDER — SODIUM CHLORIDE (PF) 0.9 % IJ SOLN
INTRAMUSCULAR | Status: AC
  Filled 2018-11-28: qty 50

## 2018-11-28 MED ORDER — FAMOTIDINE 20 MG PO TABS
20.0000 mg | ORAL_TABLET | Freq: Once | ORAL | Status: AC
  Administered 2018-11-28: 20 mg via ORAL

## 2018-11-28 MED ORDER — METOCLOPRAMIDE HCL 10 MG PO TABS: 5.0000 mg | ORAL_TABLET | Freq: Three times a day (TID) | ORAL | Status: DC | PRN

## 2018-11-28 MED ORDER — BUPIVACAINE HCL (PF) 0.5 % IJ SOLN
INTRAMUSCULAR | Status: AC
  Filled 2018-11-28: qty 10

## 2018-11-28 MED ORDER — LIDOCAINE HCL (CARDIAC) PF 100 MG/5ML IV SOSY
PREFILLED_SYRINGE | INTRAVENOUS | Status: DC | PRN
  Administered 2018-11-28: 100 mg via INTRAVENOUS

## 2018-11-28 MED ORDER — SODIUM CHLORIDE FLUSH 0.9 % IV SOLN
INTRAVENOUS | Status: AC
  Filled 2018-11-28: qty 50

## 2018-11-28 MED ORDER — MIDAZOLAM HCL 2 MG/2ML IJ SOLN
INTRAMUSCULAR | Status: AC
  Filled 2018-11-28: qty 2

## 2018-11-28 MED ORDER — LORAZEPAM 2 MG/ML IJ SOLN: 1.0000 mg | Freq: Four times a day (QID) | INTRAMUSCULAR | Status: DC | PRN

## 2018-11-28 MED ORDER — ACETAMINOPHEN 500 MG PO TABS
1000.0000 mg | ORAL_TABLET | Freq: Four times a day (QID) | ORAL | Status: AC
  Administered 2018-11-28 – 2018-11-29 (×3): 1000 mg via ORAL
  Filled 2018-11-28 (×3): qty 2

## 2018-11-28 MED ORDER — MIDAZOLAM HCL 5 MG/5ML IJ SOLN
INTRAMUSCULAR | Status: DC | PRN
  Administered 2018-11-28: 2 mg via INTRAVENOUS

## 2018-11-28 MED ORDER — FENTANYL CITRATE (PF) 100 MCG/2ML IJ SOLN: 25.0000 ug | INTRAMUSCULAR | Status: DC | PRN

## 2018-11-28 MED ORDER — TRANEXAMIC ACID 1000 MG/10ML IV SOLN
INTRAVENOUS | Status: AC
  Filled 2018-11-28: qty 10

## 2018-11-28 MED ORDER — BUPIVACAINE HCL (PF) 0.5 % IJ SOLN
INTRAMUSCULAR | Status: DC | PRN
  Administered 2018-11-28: 3 mL

## 2018-11-28 MED ORDER — PROPOFOL 10 MG/ML IV BOLUS
INTRAVENOUS | Status: DC | PRN
  Administered 2018-11-28: 30 mg via INTRAVENOUS
  Administered 2018-11-28: 50 mg via INTRAVENOUS
  Administered 2018-11-28 (×4): 30 mg via INTRAVENOUS

## 2018-11-28 MED ORDER — SODIUM CHLORIDE 0.9 % IV SOLN
INTRAVENOUS | Status: DC | PRN
  Administered 2018-11-28: 60 mL

## 2018-11-28 MED ORDER — FENTANYL CITRATE (PF) 100 MCG/2ML IJ SOLN
INTRAMUSCULAR | Status: DC | PRN
  Administered 2018-11-28: 100 ug via INTRAVENOUS

## 2018-11-28 MED ORDER — DIPHENHYDRAMINE HCL 12.5 MG/5ML PO ELIX: 12.5000 mg | ORAL_SOLUTION | ORAL | Status: DC | PRN

## 2018-11-28 MED ORDER — PROPOFOL 10 MG/ML IV BOLUS
INTRAVENOUS | Status: AC
  Filled 2018-11-28: qty 20

## 2018-11-28 MED ORDER — LABETALOL HCL 5 MG/ML IV SOLN
INTRAVENOUS | Status: DC | PRN
  Administered 2018-11-28 (×2): 5 mg via INTRAVENOUS

## 2018-11-28 MED ORDER — CEFAZOLIN SODIUM-DEXTROSE 2-4 GM/100ML-% IV SOLN
2.0000 g | Freq: Once | INTRAVENOUS | Status: AC
  Administered 2018-11-28: 2 g via INTRAVENOUS

## 2018-11-28 MED ORDER — LIDOCAINE HCL (PF) 2 % IJ SOLN
INTRAMUSCULAR | Status: AC
  Filled 2018-11-28: qty 10

## 2018-11-28 MED ORDER — ACETAMINOPHEN 325 MG PO TABS: 325.0000 mg | ORAL_TABLET | Freq: Four times a day (QID) | ORAL | Status: DC | PRN

## 2018-11-28 MED ORDER — BACITRACIN 50000 UNITS IM SOLR
INTRAMUSCULAR | Status: AC
  Filled 2018-11-28: qty 1

## 2018-11-28 MED ORDER — THIAMINE HCL 100 MG/ML IJ SOLN: 100.0000 mg | Freq: Every day | INTRAMUSCULAR | Status: DC

## 2018-11-28 MED ORDER — LABETALOL HCL 5 MG/ML IV SOLN
INTRAVENOUS | Status: AC
  Filled 2018-11-28: qty 4

## 2018-11-28 MED ORDER — MAGNESIUM HYDROXIDE 400 MG/5ML PO SUSP
30.0000 mL | Freq: Every day | ORAL | Status: DC | PRN
  Administered 2018-11-30: 30 mL via ORAL
  Filled 2018-11-28: qty 30

## 2018-11-28 MED ORDER — BISACODYL 10 MG RE SUPP
10.0000 mg | Freq: Every day | RECTAL | Status: DC | PRN
  Filled 2018-11-28: qty 1

## 2018-11-28 MED ORDER — MOMETASONE FURO-FORMOTEROL FUM 100-5 MCG/ACT IN AERO
2.0000 | INHALATION_SPRAY | Freq: Two times a day (BID) | RESPIRATORY_TRACT | Status: DC
  Administered 2018-11-28 – 2018-11-30 (×4): 2 via RESPIRATORY_TRACT
  Filled 2018-11-28 (×2): qty 8.8

## 2018-11-28 MED ORDER — HYDRALAZINE HCL 20 MG/ML IJ SOLN
10.0000 mg | Freq: Four times a day (QID) | INTRAMUSCULAR | Status: AC | PRN
  Administered 2018-11-28: 10 mg via INTRAVENOUS

## 2018-11-28 MED ORDER — FAMOTIDINE 20 MG PO TABS
ORAL_TABLET | ORAL | Status: AC
  Administered 2018-11-28: 20 mg via ORAL
  Filled 2018-11-28: qty 1

## 2018-11-28 MED ORDER — KETOROLAC TROMETHAMINE 15 MG/ML IJ SOLN
15.0000 mg | Freq: Four times a day (QID) | INTRAMUSCULAR | Status: AC
  Administered 2018-11-28 – 2018-11-29 (×4): 15 mg via INTRAVENOUS
  Filled 2018-11-28 (×4): qty 1

## 2018-11-28 MED ORDER — ONDANSETRON HCL 4 MG PO TABS: 4.0000 mg | ORAL_TABLET | Freq: Four times a day (QID) | ORAL | Status: DC | PRN

## 2018-11-28 MED ORDER — PROPOFOL 500 MG/50ML IV EMUL
INTRAVENOUS | Status: DC | PRN
  Administered 2018-11-28: 70 ug/kg/min via INTRAVENOUS

## 2018-11-28 MED ORDER — DEXTROSE 5 % IV SOLN
3.0000 g | Freq: Four times a day (QID) | INTRAVENOUS | Status: AC
  Administered 2018-11-28 – 2018-11-29 (×3): 3 g via INTRAVENOUS
  Filled 2018-11-28 (×3): qty 3

## 2018-11-28 MED ORDER — FLEET ENEMA 7-19 GM/118ML RE ENEM: 1.0000 | ENEMA | Freq: Once | RECTAL | Status: DC | PRN

## 2018-11-28 MED ORDER — KETOROLAC TROMETHAMINE 30 MG/ML IJ SOLN
INTRAMUSCULAR | Status: AC
  Filled 2018-11-28: qty 1

## 2018-11-28 MED ORDER — TRAMADOL HCL 50 MG PO TABS: 50.0000 mg | ORAL_TABLET | Freq: Four times a day (QID) | ORAL | Status: DC | PRN

## 2018-11-28 MED ORDER — ADULT MULTIVITAMIN W/MINERALS CH
1.0000 | ORAL_TABLET | Freq: Every day | ORAL | Status: DC
  Administered 2018-11-28 – 2018-11-29 (×2): 1 via ORAL
  Filled 2018-11-28 (×3): qty 1

## 2018-11-28 MED ORDER — PANTOPRAZOLE SODIUM 40 MG PO TBEC
80.0000 mg | DELAYED_RELEASE_TABLET | Freq: Every day | ORAL | Status: DC
  Administered 2018-11-29 – 2018-11-30 (×2): 80 mg via ORAL
  Filled 2018-11-28 (×2): qty 2

## 2018-11-28 MED ORDER — GLYCOPYRROLATE 0.2 MG/ML IJ SOLN
INTRAMUSCULAR | Status: AC
  Filled 2018-11-28: qty 1

## 2018-11-28 MED ORDER — ONDANSETRON HCL 4 MG/2ML IJ SOLN: 4.0000 mg | Freq: Four times a day (QID) | INTRAMUSCULAR | Status: DC | PRN

## 2018-11-28 MED ORDER — BUPIVACAINE LIPOSOME 1.3 % IJ SUSP
INTRAMUSCULAR | Status: AC
  Filled 2018-11-28: qty 20

## 2018-11-28 MED ORDER — SODIUM CHLORIDE 0.9 % IV SOLN
INTRAVENOUS | Status: DC | PRN
  Administered 2018-11-28: 3150 mL

## 2018-11-28 MED ORDER — HYDRALAZINE HCL 20 MG/ML IJ SOLN
INTRAMUSCULAR | Status: AC
  Filled 2018-11-28: qty 1

## 2018-11-28 MED ORDER — FENTANYL CITRATE (PF) 100 MCG/2ML IJ SOLN
INTRAMUSCULAR | Status: AC
  Filled 2018-11-28: qty 2

## 2018-11-28 MED ORDER — LABETALOL HCL 5 MG/ML IV SOLN
10.0000 mg | INTRAVENOUS | Status: DC | PRN
  Administered 2018-11-28: 10 mg via INTRAVENOUS

## 2018-11-28 MED ORDER — ENOXAPARIN SODIUM 40 MG/0.4ML ~~LOC~~ SOLN
40.0000 mg | SUBCUTANEOUS | Status: DC
  Administered 2018-11-29 – 2018-11-30 (×2): 40 mg via SUBCUTANEOUS
  Filled 2018-11-28 (×2): qty 0.4

## 2018-11-28 MED ORDER — METOCLOPRAMIDE HCL 5 MG/ML IJ SOLN: 5.0000 mg | Freq: Three times a day (TID) | INTRAMUSCULAR | Status: DC | PRN

## 2018-11-28 MED ORDER — BUPIVACAINE-EPINEPHRINE (PF) 0.5% -1:200000 IJ SOLN
INTRAMUSCULAR | Status: DC | PRN
  Administered 2018-11-28: 30 mL

## 2018-11-28 MED ORDER — LORAZEPAM 1 MG PO TABS
1.0000 mg | ORAL_TABLET | Freq: Four times a day (QID) | ORAL | Status: DC | PRN
  Administered 2018-11-29: 1 mg via ORAL
  Filled 2018-11-28: qty 1

## 2018-11-28 MED ORDER — FOLIC ACID 1 MG PO TABS
1.0000 mg | ORAL_TABLET | Freq: Every day | ORAL | Status: DC
  Administered 2018-11-28 – 2018-11-29 (×2): 1 mg via ORAL
  Filled 2018-11-28 (×3): qty 1

## 2018-11-28 MED ORDER — SODIUM CHLORIDE 0.9 % IV SOLN
INTRAVENOUS | Status: DC
  Administered 2018-11-28: 16:00:00 via INTRAVENOUS

## 2018-11-28 MED ORDER — DOCUSATE SODIUM 100 MG PO CAPS
100.0000 mg | ORAL_CAPSULE | Freq: Two times a day (BID) | ORAL | Status: DC
  Administered 2018-11-28 – 2018-11-29 (×3): 100 mg via ORAL
  Filled 2018-11-28 (×4): qty 1

## 2018-11-28 MED ORDER — ACETAMINOPHEN 10 MG/ML IV SOLN
INTRAVENOUS | Status: AC
  Filled 2018-11-28: qty 100

## 2018-11-28 MED ORDER — BACITRACIN 50000 UNITS IM SOLR
INTRAMUSCULAR | Status: AC
  Filled 2018-11-28: qty 2

## 2018-11-28 MED ORDER — CEFAZOLIN SODIUM-DEXTROSE 2-4 GM/100ML-% IV SOLN: 2.0000 g | Freq: Once | INTRAVENOUS | Status: DC

## 2018-11-28 MED ORDER — BUPIVACAINE-EPINEPHRINE (PF) 0.5% -1:200000 IJ SOLN
INTRAMUSCULAR | Status: AC
  Filled 2018-11-28: qty 30

## 2018-11-28 MED ORDER — HYDROMORPHONE HCL 1 MG/ML IJ SOLN
0.5000 mg | INTRAMUSCULAR | Status: DC | PRN
  Filled 2018-11-28: qty 1

## 2018-11-28 MED ORDER — SODIUM CHLORIDE 0.9 % IV SOLN
INTRAVENOUS | Status: DC | PRN
  Administered 2018-11-28: 30 ug/min via INTRAVENOUS

## 2018-11-28 MED ORDER — OXYCODONE HCL 5 MG PO TABS
5.0000 mg | ORAL_TABLET | ORAL | Status: DC | PRN
  Administered 2018-11-28: 10 mg via ORAL
  Administered 2018-11-28: 5 mg via ORAL
  Administered 2018-11-29 – 2018-11-30 (×6): 10 mg via ORAL
  Filled 2018-11-28: qty 2
  Filled 2018-11-28: qty 1
  Filled 2018-11-28 (×3): qty 2
  Filled 2018-11-28: qty 1
  Filled 2018-11-28 (×4): qty 2

## 2018-11-28 MED ORDER — TRANEXAMIC ACID 1000 MG/10ML IV SOLN
INTRAVENOUS | Status: DC | PRN
  Administered 2018-11-28: 1000 mg

## 2018-11-28 MED ORDER — LISINOPRIL 10 MG PO TABS
10.0000 mg | ORAL_TABLET | Freq: Every day | ORAL | Status: DC
  Administered 2018-11-28 – 2018-11-30 (×3): 10 mg via ORAL
  Filled 2018-11-28 (×3): qty 1

## 2018-11-28 MED ORDER — VITAMIN B-1 100 MG PO TABS
100.0000 mg | ORAL_TABLET | Freq: Every day | ORAL | Status: DC
  Administered 2018-11-28 – 2018-11-29 (×2): 100 mg via ORAL
  Filled 2018-11-28 (×3): qty 1

## 2018-11-28 MED ORDER — KETOROLAC TROMETHAMINE 30 MG/ML IJ SOLN
30.0000 mg | Freq: Once | INTRAMUSCULAR | Status: AC
  Administered 2018-11-28: 30 mg via INTRAVENOUS

## 2018-11-28 SURGICAL SUPPLY — 58 items
BANDAGE ELASTIC 6 LF NS (GAUZE/BANDAGES/DRESSINGS) ×3 IMPLANT
BEARING TIBIAL VG AS 71X12 (Joint) ×1 IMPLANT
BIT DRILL QUICK REL 1/8 2PK SL (DRILL) ×1 IMPLANT
BLADE SAW SAG 25X90X1.19 (BLADE) ×3 IMPLANT
BLADE SURG SZ20 CARB STEEL (BLADE) ×3 IMPLANT
CANISTER SUCT 1200ML W/VALVE (MISCELLANEOUS) ×3 IMPLANT
CANISTER SUCT 3000ML PPV (MISCELLANEOUS) ×3 IMPLANT
CEMENT BONE R 1X40 (Cement) ×6 IMPLANT
CEMENT VACUUM MIXING SYSTEM (MISCELLANEOUS) ×3 IMPLANT
CHLORAPREP W/TINT 26ML (MISCELLANEOUS) ×6 IMPLANT
COOLER POLAR GLACIER W/PUMP (MISCELLANEOUS) ×3 IMPLANT
COVER MAYO STAND STRL (DRAPES) ×3 IMPLANT
CUFF TOURN 24 STER (MISCELLANEOUS) ×3 IMPLANT
DRAPE IMP U-DRAPE 54X76 (DRAPES) ×3 IMPLANT
DRAPE INCISE IOBAN 66X45 STRL (DRAPES) ×3 IMPLANT
DRAPE SHEET LG 3/4 BI-LAMINATE (DRAPES) ×3 IMPLANT
DRILL QUICK RELEASE 1/8 INCH (DRILL) ×2
DRSG OPSITE POSTOP 4X10 (GAUZE/BANDAGES/DRESSINGS) ×3 IMPLANT
ELECT REM PT RETURN 9FT ADLT (ELECTROSURGICAL) ×3
ELECTRODE REM PT RTRN 9FT ADLT (ELECTROSURGICAL) ×1 IMPLANT
FEMORAL CR LEFT 72.5 (Joint) ×3 IMPLANT
GLOVE BIO SURGEON STRL SZ7.5 (GLOVE) ×15 IMPLANT
GLOVE BIO SURGEON STRL SZ8 (GLOVE) ×15 IMPLANT
GLOVE BIOGEL PI IND STRL 8 (GLOVE) ×1 IMPLANT
GLOVE BIOGEL PI INDICATOR 8 (GLOVE) ×2
GLOVE INDICATOR 8.0 STRL GRN (GLOVE) ×3 IMPLANT
GOWN STRL REUS W/ TWL LRG LVL3 (GOWN DISPOSABLE) ×2 IMPLANT
GOWN STRL REUS W/ TWL XL LVL3 (GOWN DISPOSABLE) ×1 IMPLANT
GOWN STRL REUS W/TWL LRG LVL3 (GOWN DISPOSABLE) ×4
GOWN STRL REUS W/TWL XL LVL3 (GOWN DISPOSABLE) ×2
HOLDER FOLEY CATH W/STRAP (MISCELLANEOUS) ×3 IMPLANT
HOOD PEEL AWAY FLYTE STAYCOOL (MISCELLANEOUS) ×9 IMPLANT
IMMBOLIZER KNEE 19 BLUE UNIV (SOFTGOODS) ×3 IMPLANT
KIT TURNOVER KIT A (KITS) ×3 IMPLANT
NDL SAFETY ECLIPSE 18X1.5 (NEEDLE) ×2 IMPLANT
NEEDLE HYPO 18GX1.5 SHARP (NEEDLE) ×4
NEEDLE SPNL 20GX3.5 QUINCKE YW (NEEDLE) ×3 IMPLANT
NS IRRIG 1000ML POUR BTL (IV SOLUTION) ×3 IMPLANT
PACK TOTAL KNEE (MISCELLANEOUS) ×3 IMPLANT
PAD WRAPON POLAR KNEE (MISCELLANEOUS) ×1 IMPLANT
PATELLA STD 34X8.5 (Orthopedic Implant) ×3 IMPLANT
PLATE KNEE TIBIAL 71MM FIXED (Plate) ×3 IMPLANT
PULSAVAC PLUS IRRIG FAN TIP (DISPOSABLE) ×3
SOL .9 NS 3000ML IRR  AL (IV SOLUTION) ×2
SOL .9 NS 3000ML IRR UROMATIC (IV SOLUTION) ×1 IMPLANT
STAPLER SKIN PROX 35W (STAPLE) ×3 IMPLANT
SUCTION FRAZIER HANDLE 10FR (MISCELLANEOUS) ×2
SUCTION TUBE FRAZIER 10FR DISP (MISCELLANEOUS) ×1 IMPLANT
SUT VIC AB 0 CT1 36 (SUTURE) ×9 IMPLANT
SUT VIC AB 2-0 CT1 27 (SUTURE) ×6
SUT VIC AB 2-0 CT1 TAPERPNT 27 (SUTURE) ×3 IMPLANT
SYR 10ML LL (SYRINGE) ×3 IMPLANT
SYR 20CC LL (SYRINGE) ×3 IMPLANT
SYR 30ML LL (SYRINGE) ×9 IMPLANT
TIBIAL BEARING VG AS 71X12 (Joint) ×3 IMPLANT
TIP FAN IRRIG PULSAVAC PLUS (DISPOSABLE) ×1 IMPLANT
TRAY FOLEY MTR SLVR 16FR STAT (SET/KITS/TRAYS/PACK) ×3 IMPLANT
WRAPON POLAR PAD KNEE (MISCELLANEOUS) ×3

## 2018-11-28 NOTE — Op Note (Signed)
11/28/2018  1:10 PM  Patient:   Aimee Buck  Pre-Op Diagnosis:   Advanced degenerative joint disease, left knee.  Post-Op Diagnosis:   Same  Procedure:   Left TKA using all-cemented Bi[employer] Vanguard system with a 72.5 mm PCR femur, a 71 mm tibial tray with a 12 mm anterior stabilized e-poly insert, and a 34 x 8.5 mm all-poly 3-pegged domed patella.  Surgeon:   Pascal Lux, MD  Assistant:   Cameron Proud, PA-C   Anesthesia:   Spinal  Findings:   As above  Complications:   None  EBL:   10 cc  Fluids:   1000 cc crystalloid  UOP:   500 cc  TT:   100 minutes at 300 mmHg  Drains:   None  Closure:   Staples  Implants:   As above  Brief Clinical Note:   The patient is a 57 year old female with a long history of progressively worsening left knee pain. The patient's symptoms have progressed despite medications, activity modification, injections, etc. The patient's history and examination were consistent with advanced degenerative joint disease of the right knee confirmed by plain radiographs. The patient presents at this time for a left total knee arthroplasty.  Procedure:   The patient was brought into the operating room. After adequate spinal anesthesia was obtained, the patient was lain in the supine position. A Foley catheter was placed by the nurse before the right lower extremity was prepped with ChloraPrep solution and draped sterilely. Preoperative antibiotics were administered. After verifying the proper laterality with a surgical timeout, the limb was exsanguinated with an Esmarch and the tourniquet inflated to 300 mmHg. A standard anterior approach to the knee was made through an approximately 7 inch incision. The incision was carried down through the subcutaneous tissues to expose superficial retinaculum. This was split the length of the incision and the medial flap elevated sufficiently to expose the medial retinaculum. The medial retinaculum was incised, leaving  a 3-4 mm cuff of tissue on the patella. This was extended distally along the medial border of the patellar tendon and proximally through the medial third of the quadriceps tendon. A subtotal fat pad excision was performed before the soft tissues were elevated off the anteromedial and anterolateral aspects of the proximal tibia to the level of the collateral ligaments. The anterior portions of the medial and lateral menisci were removed, as was the anterior cruciate ligament. With the knee flexed to 90, the external tibial guide was positioned and the appropriate proximal tibial cut made. This piece was taken to the back table where it was measured and found to be optimally replicated by a 71 mm component.  Attention was directed to the distal femur. The intramedullary canal was accessed through a 3/8" drill hole. The intramedullary guide was inserted and positioned in order to obtain a neutral flexion gap. The intercondylar block was positioned with care taken to avoid notching the anterior cortex of the femur. The appropriate cut was made. Next, the distal cutting block was placed at 6 of valgus alignment. Using the 9 mm slot, the distal cut was made. The distal femur was measured and found to be optimally replicated by the 27.0 mm component. The 72.5 mm 4-in-1 cutting block was positioned and first the posterior, then the posterior chamfer, the anterior chamfer, and finally the anterior cuts were made. At this point, the posterior portions medial and lateral menisci were removed. A trial reduction was performed using the appropriate femoral and tibial components  with first the 10 mm and then the 12 mm insert. This demonstrated excellent stability to varus and valgus stressing both in flexion and extension while permitting full extension. Patella tracking was assessed and found to be excellent. Therefore, the tibial guide position was marked on the proximal tibia. The patella thickness was measured and found to  be 22 mm. Therefore, the appropriate cut was made. The patellar surface was measured and found to be optimally replicated by the 34 mm component. The three peg holes were drilled in place before the trial button was inserted. Patella tracking was assessed and found to be excellent, passing the "no thumb test". The lug holes were drilled into the distal femur before the trial component was removed, leaving only the tibial tray. The keel was then created using the appropriate tower, reamer, and punch.  The bony surfaces were prepared for cementing by irrigating them thoroughly with bacitracin saline solution via the jet lavage system. A bone plug was fashioned from some of the bone that had been removed previously and used to plug the distal femoral canal. In addition, 20 cc of Exparel diluted out to 60 cc with normal saline and 30 cc of 0.5% Sensorcaine were injected into the postero-medial and postero-lateral aspects of the knee, the medial and lateral gutter regions, and the peri-incisional tissues to help with postoperative analgesia. Meanwhile, the cement was being mixed on the back table. When it was ready, the tibial tray was cemented in first. The excess cement was removed using [employer] fast streamer. Next, the femoral component was impacted into place. Again, the excess cement was removed using [employer] fast streamer. The 12 mm trial insert was positioned and the knee brought into extension while the cement hardened. Finally, the patella was cemented into place and secured using the patellar clamp. Again, the excess cement was removed using [employer] fast streamer. Once the cement had hardened, the knee was placed through a range of motion with the findings as described above. Therefore, the trial insert was removed and, after verifying that no cement had been retained posteriorly, the permanent valve millimeter anterior stabilized E-polyethylene insert was positioned and secured using the appropriate key locking mechanism.  Again the knee was placed through a range of motion with the findings as described above.  The wound was copiously irrigated with bacitracin saline solution using the jet lavage system before the quadriceps tendon and retinacular layer were reapproximated using #0 Vicryl interrupted sutures. The superficial retinacular layer also was closed using a running #0 Vicryl suture. A total of 10 cc of transexemic acid (TXA) was injected intra-articularly before the subcutaneous tissues were closed in several layers using 2-0 Vicryl interrupted sutures. The skin was closed using staples. A sterile honeycomb dressing was applied to the skin before the leg was wrapped with an Ace wrap to accommodate the Polar Care device. The patient was then awakened and returned to the recovery room in satisfactory condition after tolerating the procedure well.

## 2018-11-28 NOTE — OR Nursing (Signed)
Patient has no complaints at this time, no pain spinal is working, BP elevated 163/113 called anesthesia they order labetalol 10 mg and when that did not work she order hydralazine

## 2018-11-28 NOTE — Transfer of Care (Signed)
Immediate Anesthesia Transfer of Care Note  Patient: Aimee Buck [facility]  Procedure(s) Performed: TOTAL KNEE ARTHROPLASTY (Left Knee)  Patient Location: PACU  Anesthesia Type:Spinal  Level of Consciousness: awake, alert , oriented and patient cooperative  Airway & Oxygen Therapy: Patient Spontanous Breathing and Patient connected to nasal cannula oxygen  Post-op Assessment: Report given to RN and Post -op Vital signs reviewed and stable  Post vital signs: Reviewed and stable  Last Vitals:  Vitals Value Taken Time  BP    Temp    Pulse    Resp    SpO2      Last Pain:  Vitals:   11/28/18 0911  TempSrc: Tympanic  PainSc: 10-Worst pain ever         Complications: No apparent anesthesia complications

## 2018-11-28 NOTE — NC FL2 (Signed)
[place] LEVEL OF CARE SCREENING TOOL     IDENTIFICATION  Patient Name: Aimee Buck Birthdate: 06/04/1961 Sex: female Admission Date (Current Location): 11/28/2018  [place] and [place] Number:  Aimee Buck ([id number] [facility]) Facility and Address:  [facility], [address], [place], [place] [address]      Provider Number: [id number]  Attending Physician Name and Address:  Corky Mull, MD  Relative Name and Phone Number:       Current Level of Care: Hospital Recommended Level of Care: [place] Prior Approval Number:    Date Approved/Denied:   PASRR Number: [id number])  Discharge Plan: SNF    Current Diagnoses: Patient Active Problem List   Diagnosis Date Noted  . Status post total knee replacement using cement, left 11/28/2018  . Pneumonia 03/05/2018  . Alcohol abuse   . Depression   . Pyelonephritis 03/09/2017  . Hyperglycemia 10/14/2016    Orientation RESPIRATION BLADDER Height & Weight     Self, Time, Situation, Place  Normal Continent Weight: 257 lb 15 oz (117 kg) Height:  5\' 6"  (167.6 cm)  BEHAVIORAL SYMPTOMS/MOOD NEUROLOGICAL BOWEL NUTRITION STATUS      Continent Diet(Diet: Heart Healthy/ Carb Modified. )  AMBULATORY STATUS COMMUNICATION OF NEEDS Skin   Extensive Assist Verbally Surgical wounds(Incision: Left Knee. )                       Personal Care Assistance Level of Assistance  Bathing, Feeding, Dressing Bathing Assistance: Limited assistance Feeding assistance: Independent Dressing Assistance: Limited assistance     Functional Limitations Info  Sight, Hearing, Speech Sight Info: Adequate Hearing Info: Adequate Speech Info: Adequate    SPECIAL CARE FACTORS FREQUENCY  PT (By licensed PT), OT (By licensed OT)     PT Frequency: (5) OT Frequency: (5)            Contractures      Additional Factors Info  Code Status, Allergies Code Status Info: (Full Code.  ) Allergies Info: (Metrizamide, Contrast Media Iodinated Diagnostic Agents)           Current Medications (11/28/2018):  This is the current hospital active medication list Current Facility-Administered Medications  Medication Dose Route Frequency Provider Last Rate Last Dose  . 0.9 %  sodium chloride infusion   Intravenous Continuous Poggi, Marshall Cork, MD 75 mL/hr at 11/28/18 1613    . acetaminophen (TYLENOL) tablet 1,000 mg  1,000 mg Oral Q6H Poggi, Marshall Cork, MD   1,000 mg at 11/28/18 1608  . [START ON 11/29/2018] acetaminophen (TYLENOL) tablet 325-650 mg  325-650 mg Oral Q6H PRN Poggi, Marshall Cork, MD      . bisacodyl (DULCOLAX) suppository 10 mg  10 mg Rectal Daily PRN Poggi, Marshall Cork, MD      . ceFAZolin (ANCEF) 3 g in dextrose 5 % 50 mL IVPB  3 g Intravenous Q6H Poggi, Marshall Cork, MD 100 mL/hr at 11/28/18 1712 3 g at 11/28/18 1712  . diphenhydrAMINE (BENADRYL) 12.5 MG/5ML elixir 12.5-25 mg  12.5-25 mg Oral Q4H PRN Poggi, Marshall Cork, MD      . docusate sodium (COLACE) capsule 100 mg  100 mg Oral BID Poggi, Marshall Cork, MD      . Derrill Memo ON 11/29/2018] enoxaparin (LOVENOX) injection 40 mg  40 mg Subcutaneous Q24H Poggi, Marshall Cork, MD      . folic acid (FOLVITE) tablet 1 mg  1 mg Oral Daily Lattie Corns, PA-C      .  hydrALAZINE (APRESOLINE) 20 MG/ML injection           . HYDROmorphone (DILAUDID) injection 0.5-1 mg  0.5-1 mg Intravenous Q4H PRN Poggi, Marshall Cork, MD      . ketorolac (TORADOL) 15 MG/ML injection 15 mg  15 mg Intravenous Q6H Poggi, Marshall Cork, MD   15 mg at 11/28/18 1609  . ketorolac (TORADOL) 30 MG/ML injection           . labetalol (NORMODYNE,TRANDATE) 5 MG/ML injection           . lisinopril (PRINIVIL,ZESTRIL) tablet 10 mg  10 mg Oral Daily Poggi, Marshall Cork, MD   10 mg at 11/28/18 1609  . LORazepam (ATIVAN) tablet 1 mg  1 mg Oral Q6H PRN Lattie Corns, PA-C       Or  . LORazepam (ATIVAN) injection 1 mg  1 mg Intravenous Q6H PRN Lattie Corns, PA-C      . magnesium hydroxide (MILK OF  MAGNESIA) suspension 30 mL  30 mL Oral Daily PRN Poggi, Marshall Cork, MD      . metoCLOPramide (REGLAN) tablet 5-10 mg  5-10 mg Oral Q8H PRN Poggi, Marshall Cork, MD       Or  . metoCLOPramide (REGLAN) injection 5-10 mg  5-10 mg Intravenous Q8H PRN Poggi, Marshall Cork, MD      . mometasone-formoterol (DULERA) 100-5 MCG/ACT inhaler 2 puff  2 puff Inhalation BID Poggi, Marshall Cork, MD      . multivitamin with minerals tablet 1 tablet  1 tablet Oral Daily Lattie Corns, PA-C      . ondansetron [facility]) tablet 4 mg  4 mg Oral Q6H PRN Poggi, Marshall Cork, MD       Or  . ondansetron (ZOFRAN) injection 4 mg  4 mg Intravenous Q6H PRN Poggi, Marshall Cork, MD      . oxyCODONE (Oxy IR/ROXICODONE) immediate release tablet 5-10 mg  5-10 mg Oral Q4H PRN Poggi, Marshall Cork, MD   5 mg at 11/28/18 1609  . [START ON 11/29/2018] pantoprazole (PROTONIX) EC tablet 80 mg  80 mg Oral Daily Poggi, Marshall Cork, MD      . sodium phosphate (FLEET) 7-19 GM/118ML enema 1 enema  1 enema Rectal Once PRN Poggi, Marshall Cork, MD      . thiamine (VITAMIN B-1) tablet 100 mg  100 mg Oral Daily Lattie Corns, PA-C       Or  . thiamine (B-1) injection 100 mg  100 mg Intravenous Daily Lattie Corns, PA-C      . traMADol Veatrice Bourbon) tablet 50 mg  50 mg Oral Q6H PRN Poggi, Marshall Cork, MD         Discharge Medications: Please see discharge summary for a list of discharge medications.  Relevant Imaging Results:  Relevant Lab Results:   Additional Information (SSN: [id number])  Sandeep Radell, Veronia Beets, LCSW

## 2018-11-28 NOTE — H&P (Signed)
Paper H&P to be scanned into permanent record. H&P reviewed and patient re-examined. No changes. 

## 2018-11-28 NOTE — Care Management (Signed)
Reviewing chart patient has history of ETOH abuse and suicide ideation.  RNCM has updated charge RN that patient may progress better closer to nurses status.  Please monitor for symptoms of withdrawal. RNCM following.

## 2018-11-28 NOTE — Anesthesia Preprocedure Evaluation (Addendum)
Anesthesia Evaluation  Patient identified by MRN, date of birth, ID band Patient awake    Reviewed: Allergy & Precautions, H&P , NPO status , Patient's Chart, lab work & pertinent test results  Airway Mallampati: III       Dental  (+) Poor Dentition, Chipped   Pulmonary asthma , COPD (denies recent exacerbations),  COPD inhaler, Current Smoker,           Cardiovascular hypertension,      Neuro/Psych PSYCHIATRIC DISORDERS Depression negative neurological ROS     GI/Hepatic Neg liver ROS, GERD  ,  Endo/Other  diabetes  Renal/GU      Musculoskeletal   Abdominal   Peds  Hematology negative hematology ROS (+)   Anesthesia Other Findings Past Medical History: No date: Asthma No date: COPD (chronic obstructive pulmonary disease) (HCC) No date: Diabetes ([place]) No date: GERD (gastroesophageal reflux disease) No date: Hypertension  Past Surgical History: No date: ANKLE SURGERY; Left No date: DENTAL SURGERY  BMI    Body Mass Index:  41.63 kg/m      Reproductive/Obstetrics negative OB ROS                            Anesthesia Physical Anesthesia Plan  ASA: III  Anesthesia Plan: Spinal and General   Post-op Pain Management:    Induction:   PONV Risk Score and Plan: Propofol infusion and TIVA  Airway Management Planned: Natural Airway and Simple Face Mask  Additional Equipment:   Intra-op Plan:   Post-operative Plan:   Informed Consent: I have reviewed the patients History and Physical, chart, labs and discussed the procedure including the risks, benefits and alternatives for the proposed anesthesia with the patient or authorized representative who has indicated his/her understanding and acceptance.   Dental Advisory Given  Plan Discussed with: Anesthesiologist, CRNA and Surgeon  Anesthesia Plan Comments:         Anesthesia Quick Evaluation

## 2018-11-28 NOTE — Anesthesia Procedure Notes (Signed)
Spinal  Patient location during procedure: OR Staffing Resident/CRNA: Bernardo Heater, CRNA Preanesthetic Checklist Completed: patient identified, site marked, surgical consent, pre-op evaluation, timeout performed, IV checked, risks and benefits discussed and monitors and equipment checked Spinal Block Patient position: sitting Prep: Betadine and ChloraPrep Patient monitoring: heart rate, continuous pulse ox, blood pressure and cardiac monitor Approach: midline Location: L3-4 Injection technique: single-shot Needle Needle type: Introducer and Pencil-Tip  Needle gauge: 25 G Needle length: 12.7 cm Additional Notes Negative paresthesia. Negative blood return. Positive free-flowing CSF. Expiration date of kit checked and confirmed. Patient tolerated procedure well, without complications.

## 2018-11-28 NOTE — Anesthesia Post-op Follow-up Note (Signed)
Anesthesia QCDR form completed.        

## 2018-11-29 ENCOUNTER — Encounter: Payer: Self-pay | Admitting: Surgery

## 2018-11-29 LAB — CBC WITH DIFFERENTIAL/PLATELET
Abs Immature Granulocytes: 0.02 10*3/uL (ref 0.00–0.07)
Basophils Absolute: 0 10*3/uL (ref 0.0–0.1)
Basophils Relative: 1 %
Eosinophils Absolute: 0.2 10*3/uL (ref 0.0–0.5)
Eosinophils Relative: 3 %
HCT: 34.4 % — ABNORMAL LOW (ref 36.0–46.0)
Hemoglobin: 11.1 g/dL — ABNORMAL LOW (ref 12.0–15.0)
Immature Granulocytes: 0 %
Lymphocytes Relative: 21 %
Lymphs Abs: 1.1 10*3/uL (ref 0.7–4.0)
MCH: 36 pg — ABNORMAL HIGH (ref 26.0–34.0)
MCHC: 32.3 g/dL (ref 30.0–36.0)
MCV: 111.7 fL — ABNORMAL HIGH (ref 80.0–100.0)
MONO ABS: 0.5 10*3/uL (ref 0.1–1.0)
Monocytes Relative: 10 %
Neutro Abs: 3.5 10*3/uL (ref 1.7–7.7)
Neutrophils Relative %: 65 %
Platelets: 228 10*3/uL (ref 150–400)
RBC: 3.08 MIL/uL — ABNORMAL LOW (ref 3.87–5.11)
RDW: 14.6 % (ref 11.5–15.5)
WBC: 5.3 10*3/uL (ref 4.0–10.5)
nRBC: 0 % (ref 0.0–0.2)

## 2018-11-29 LAB — BASIC METABOLIC PANEL
ANION GAP: 6 (ref 5–15)
BUN: 16 mg/dL (ref 6–20)
CO2: 25 mmol/L (ref 22–32)
Calcium: 8.2 mg/dL — ABNORMAL LOW (ref 8.9–10.3)
Chloride: 108 mmol/L (ref 98–111)
Creatinine, Ser: 1.32 mg/dL — ABNORMAL HIGH (ref 0.44–1.00)
GFR calc Af Amer: 53 mL/min — ABNORMAL LOW (ref >60)
GFR calc non Af Amer: 45 mL/min — ABNORMAL LOW (ref >60)
Glucose, Bld: 112 mg/dL — ABNORMAL HIGH (ref 70–99)
Potassium: 3.6 mmol/L (ref 3.5–5.1)
Sodium: 139 mmol/L (ref 135–145)

## 2018-11-29 LAB — GLUCOSE, CAPILLARY: Glucose-Capillary: >600 mg/dL (ref 70–99)

## 2018-11-29 MED ORDER — HYDRALAZINE HCL 20 MG/ML IJ SOLN
10.0000 mg | Freq: Four times a day (QID) | INTRAMUSCULAR | Status: DC | PRN
  Administered 2018-11-29: 20 mg via INTRAVENOUS
  Filled 2018-11-29 (×2): qty 1

## 2018-11-29 MED ORDER — TRAMADOL HCL 50 MG PO TABS
50.0000 mg | ORAL_TABLET | Freq: Four times a day (QID) | ORAL | Status: DC | PRN
  Administered 2018-11-29: 100 mg via ORAL
  Filled 2018-11-29: qty 2

## 2018-11-29 NOTE — Progress Notes (Signed)
Physical Therapy Treatment Patient Details Name: Aimee Buck MRN: [id number] DOB: [birth year] Today's Date: 11/29/2018    History of Present Illness Patient is a 57 year old female s/p left TKA. PMH to include asthma, COPD, DM, GERD, HTN    PT Comments    Patient received in recliner, reports her husband went home. She reports she had pain meds about 30 min ago. Patient able to transfer with cga and cues for safety. Ambulating in hallway with rw for 80 feet with cga. Cues to stay close to ad for maximal effectiveness, cues to increase heel strike. Patient will benefit from continued skilled PT while at [facility] to increase strength, rom and independence with mobility.       Follow Up Recommendations  Home health PT     Equipment Recommendations  Rolling walker with 5" wheels    Recommendations for Other Services       Precautions / Restrictions Precautions Precautions: Fall Restrictions Weight Bearing Restrictions: Yes Other Position/Activity Restrictions: WBAT    Mobility  Bed Mobility Overal bed mobility: Independent             General bed mobility comments: not assessed this session, in recliner  Transfers Overall transfer level: Needs assistance Equipment used: Rolling walker (2 wheeled) Transfers: Sit to/from Stand Sit to Stand: Min guard;Modified independent (Device/Increase time)         General transfer comment: cues needed for safety with transfers, hand placement, turning all the way around and backing up to chair prior to sitting.   Ambulation/Gait Ambulation/Gait assistance: Modified independent (Device/Increase time);Min guard Gait Distance (Feet): 75 Feet Assistive device: Rolling walker (2 wheeled) Gait Pattern/deviations: Step-to pattern;Decreased stance time - left Gait velocity: decreased   General Gait Details: cues to step up into walker for most effective use.    Stairs             Wheelchair Mobility    Modified  [place] (Stroke Patients Only)       Balance Overall balance assessment: Needs assistance;Mild deficits observed, not formally tested Sitting-balance support: No upper extremity supported Sitting balance-Leahy Scale: Good     Standing balance support: Bilateral upper extremity supported Standing balance-Leahy Scale: Fair                              Cognition Arousal/Alertness: Awake/alert Behavior During Therapy: WFL for tasks assessed/performed Overall Cognitive Status: Within Functional Limits for tasks assessed                                        Exercises Total Joint Exercises Ankle Circles/Pumps: AROM;10 reps;Left Quad Sets: AROM;10 reps;Left Heel Slides: AROM;10 reps;Left Hip ABduction/ADduction: AROM;10 reps;Left Straight Leg Raises: AROM;10 reps;Left;Supine Knee Flexion: AAROM;5 reps;Left Goniometric ROM: 5-73    General Comments        Pertinent Vitals/Pain Pain Assessment: 0-10 Pain Score: 7  Pain Descriptors / Indicators: Aching;Discomfort;Operative site guarding Pain Intervention(s): Monitored during session;Limited activity within patient's tolerance;Ice applied;Premedicated before session    Home Living Family/patient expects to be discharged to:: Private residence Living Arrangements: Spouse/significant other   Type of Home: House Home Access: Stairs to enter Entrance Stairs-Rails: Can reach both Home Layout: One level Home Equipment: None      Prior Function Level of Independence: Independent          PT Goals (  current goals can now be found in the care plan section) Acute Rehab PT Goals Patient Stated Goal: to return home PT Goal Formulation: With patient Time For Goal Achievement: 12/13/18 Potential to Achieve Goals: Good Progress towards PT goals: Progressing toward goals    Frequency    BID      PT Plan Current plan remains appropriate    Co-evaluation              AM-PAC PT "6  Clicks" Mobility   Outcome Measure  Help needed turning from your back to your side while in a flat bed without using bedrails?: A Little Help needed moving from lying on your back to sitting on the side of a flat bed without using bedrails?: A Little Help needed moving to and from a bed to a chair (including a wheelchair)?: A Little Help needed standing up from a chair using your arms (e.g., wheelchair or bedside chair)?: A Little Help needed to walk in hospital room?: A Little Help needed climbing 3-5 steps with a railing? : A Lot 6 Click Score: 17    End of Session Equipment Utilized During Treatment: Gait belt Activity Tolerance: Patient tolerated treatment well;Patient limited by pain;Patient limited by fatigue Patient left: in chair;with call bell/phone within reach;with chair alarm set Nurse Communication: Mobility status PT Visit Diagnosis: Muscle weakness (generalized) (M62.81);Difficulty in walking, not elsewhere classified (R26.2) Pain - Right/Left: Left Pain - part of body: Knee     Time: 8453-6468 PT Time Calculation (min) (ACUTE ONLY): 17 min  Charges:  $Gait Training: 8-22 mins $Therapeutic Activity: 8-22 mins                     Tam Delisle, PT, GCS 11/29/18,1:48 PM

## 2018-11-29 NOTE — Progress Notes (Signed)
cbg at 1054 is not for this patient.  This is an error. Marland Kitchen..

## 2018-11-29 NOTE — Progress Notes (Signed)
Subjective: 1 Day Post-Op Procedure(s) (LRB): TOTAL KNEE ARTHROPLASTY (Left) Patient reports pain as 6 on 0-10 scale.   Patient is well but is hypertensive. Sleeping when I entered the room this AM. PT and Care management to assist with discharge planning, patient wants to go home tomorrow. Negative for chest pain and shortness of breath Fever: no Gastrointestinal:Negative for nausea and vomiting  Objective: Vital signs in last 24 hours: Temp:  [96.4 F (35.8 C)-99 F (37.2 C)] 98.2 F (36.8 C) (12/11 0727) Pulse Rate:  [81-107] 107 (12/11 0727) Resp:  [14-24] 18 (12/11 0727) BP: (135-173)/(90-118) 173/115 (12/11 0727) SpO2:  [95 %-100 %] 95 % (12/11 0727) Weight:  [683 kg] 117 kg (12/10 0911)  Intake/Output from previous day:  Intake/Output Summary (Last 24 hours) at 11/29/2018 0748 Last data filed at 11/29/2018 0600 Gross per 24 hour  Intake 1198.55 ml  Output 510 ml  Net 688.55 ml    Intake/Output this shift: No intake/output data recorded.  Labs: Recent Labs    11/28/18 0931 11/29/18 0316  HGB 14.3 11.1*   Recent Labs    11/28/18 0931 11/29/18 0316  WBC  --  5.3  RBC  --  3.08*  HCT 42.0 34.4*  PLT  --  228   Recent Labs    11/28/18 0931 11/29/18 0316  NA 140 139  K 3.9 3.6  CL  --  108  CO2  --  25  BUN  --  16  CREATININE  --  1.32*  GLUCOSE 111* 112*  CALCIUM  --  8.2*   No results for input(s): LABPT, INR in the last 72 hours.   EXAM General - Patient is Alert, Appropriate and Oriented Extremity - ABD soft Sensation intact distally Intact pulses distally Dorsiflexion/Plantar flexion intact Incision: scant drainage No cellulitis present Dressing/Incision - blood tinged drainage, appears old. Motor Function - intact, moving foot and toes well on exam.  Abdomen soft on exam this AM, normal bowel sounds.  Past Medical History:  Diagnosis Date  . Asthma   . COPD (chronic obstructive pulmonary disease) ([place])   . Diabetes ([place])   .  GERD (gastroesophageal reflux disease)   . Hypertension     Assessment/Plan: 1 Day Post-Op Procedure(s) (LRB): TOTAL KNEE ARTHROPLASTY (Left) Active Problems:   Status post total knee replacement using cement, left  Estimated body mass index is 41.63 kg/m as calculated from the following:   Height as of this encounter: 5\' 6"  (1.676 m).   Weight as of this encounter: 117 kg. Advance diet Up with therapy D/C IV fluids when tolerating po intake.  Labs reviewed this AM, Hg 11.1 BP 173/115, on hydralazine.  Continue to monitor. Up with therapy today. Possible discharge home tomorrow pending progress. Begin working on having a BM.  DVT Prophylaxis - Lovenox, Foot Pumps and TED hose Weight-Bearing as tolerated to left leg  J. Cameron Proud, PA-C [facility] Orthopaedic Surgery 11/29/2018, 7:48 AM

## 2018-11-29 NOTE — Care Management Note (Addendum)
Case Management Note  Patient Details  Name: Aimee Buck MRN: [id number] Date of Birth: 09-29-1961  Subjective/Objective:                  RNCM met with patient regarding discharge planning. Patient husband was asleep at bedside. She plans to transition to home and would like to use [facility] for home health. She states she will need a rolling walker. She uses [facility] for medications [id number]. Typically, Lovenox brand name is covered under Medicaid.  Pharmacy is checking; patient cost will be $3. Pharmacy will have Brand name by tomorrow.  Per Dr. Roland Rack patient will need Lovenox 88m injection daily for 14 days not refills. Patient states she discussed this with Dr. PRoland Rackprior to surgery. Patient states she will need a rolling walker. Per PT, patient's bed was soaked with sweat and patient's husband is urinating in bucket at bedside. RNCM noticed that patient's hands were visibly trembling while she was [facility]  PT shared that she is unable to get patient to chair because husband is sleeping there. He is "demanding staff to empty his urine bucket".   AD updated that this may be interfering with patient progress.   Action/Plan: Home health list provided via CMS.gov with 3-5 star ratings and [facility]  [facility] for [facility]; [facility]  Lovenox call ed in to patient's pharmacy.   Expected Discharge Date:                  Expected Discharge Plan:     In-House Referral:     Discharge planning Services  CM Consult  Post Acute Care Choice:  Home Health, Durable Medical Equipment Choice offered to:  Patient  DME Arranged:  Walker rolling DME Agency:  [facility]  [facility]  Status of Service:  In process,  will continue to follow  If discussed at Long Length of Stay Meetings, dates discussed:    Additional Comments: Lovenox generic was covered. Patient cost $3.  AMarshell Garfinkel RN 11/29/2018, 11:04 AM

## 2018-11-29 NOTE — Plan of Care (Signed)

## 2018-11-29 NOTE — Evaluation (Signed)
Physical Therapy Evaluation Patient Details Name: Aimee Buck MRN: [id number] DOB: 11-01-1961 Today's Date: 11/29/2018   History of Present Illness  Patient is a 57 year old female s/p left TKA. PMH to include asthma, COPD, DM, GERD, HTN (apparently has h/o ETOH abuse as well per care management)   Clinical Impression  Patient reports she had pain meds and hour ago and is ready to get up and try to walk. Patient has husband in room who is sleeping in recliner. No involved in wife's care at all. Patient able to perform supine LE exercises in the bed. Performed bed mobility and transfers with set up assist, supervision and cues for safety. Ambulated 50 feet with rolling walker, wbat on left, cga. Patient demonstrates good motivation to improve function and go home. Patient will benefit from skilled PT to address her functional limitations, to improve left LE strength and left knee rom.      Follow Up Recommendations Home health PT    Equipment Recommendations  Rolling walker with 5" wheels    Recommendations for Other Services       Precautions / Restrictions Precautions Precautions: Fall Restrictions Weight Bearing Restrictions: Yes Other Position/Activity Restrictions: WBAT on left      Mobility  Bed Mobility Overal bed mobility: Independent             General bed mobility comments: patient able to perfom independently with increased time and effort.   Transfers Overall transfer level: Needs assistance Equipment used: Rolling walker (2 wheeled) Transfers: Sit to/from Stand Sit to Stand: Min guard         General transfer comment: cues to push up from bed  Ambulation/Gait Ambulation/Gait assistance: Modified independent (Device/Increase time);Min guard Gait Distance (Feet): 50 Feet Assistive device: Rolling walker (2 wheeled) Gait Pattern/deviations: Step-to pattern;Decreased stance time - left;Antalgic Gait velocity: decreased      Stairs             Wheelchair Mobility    Modified Rankin (Stroke Patients Only)       Balance Overall balance assessment: Mild deficits observed, not formally tested                                           Pertinent Vitals/Pain Pain Assessment: 0-10 Pain Score: 4  Pain Descriptors / Indicators: Aching;Discomfort;Throbbing;Operative site guarding Pain Intervention(s): Monitored during session;Premedicated before session;Ice applied    Home Living Family/patient expects to be discharged to:: Private residence Living Arrangements: Spouse/significant other   Type of Home: House Home Access: Stairs to enter Entrance Stairs-Rails: Can reach both Entrance Stairs-Number of Steps: 4 Home Layout: One level Home Equipment: None      Prior Function Level of Independence: Independent               Hand Dominance        Extremity/Trunk Assessment   Upper Extremity Assessment Upper Extremity Assessment: Overall WFL for tasks assessed    Lower Extremity Assessment Lower Extremity Assessment: Overall WFL for tasks assessed    Cervical / Trunk Assessment Cervical / Trunk Assessment: Normal  Communication   Communication: No difficulties  Cognition Arousal/Alertness: Awake/alert Behavior During Therapy: WFL for tasks assessed/performed Overall Cognitive Status: Within Functional Limits for tasks assessed  General Comments      Exercises Total Joint Exercises Ankle Circles/Pumps: AROM;10 reps;Supine;Both Quad Sets: AROM;10 reps;Supine;Both Heel Slides: AROM;10 reps;Left;Supine Hip ABduction/ADduction: AROM;10 reps;Left;Supine Straight Leg Raises: AROM;10 reps;Left;Supine Knee Flexion: AAROM;Left;5 reps Goniometric ROM: 5-73   Assessment/Plan    PT Assessment Patient needs continued PT services  PT Problem List Decreased strength;Decreased range of motion;Decreased activity tolerance;Decreased  balance;Decreased knowledge of use of DME;Decreased mobility;Decreased safety awareness       PT Treatment Interventions DME instruction;Therapeutic activities;Gait training;Therapeutic exercise;Patient/family education;Stair training;Balance training;Functional mobility training    PT Goals (Current goals can be found in the Care Plan section)  Acute Rehab PT Goals Patient Stated Goal: to return home PT Goal Formulation: With patient Time For Goal Achievement: 12/13/18 Potential to Achieve Goals: Good    Frequency BID   Barriers to discharge Decreased caregiver support      Co-evaluation               AM-PAC PT "6 Clicks" Mobility  Outcome Measure Help needed turning from your back to your side while in a flat bed without using bedrails?: A Little Help needed moving from lying on your back to sitting on the side of a flat bed without using bedrails?: A Little Help needed moving to and from a bed to a chair (including a wheelchair)?: A Little Help needed standing up from a chair using your arms (e.g., wheelchair or bedside chair)?: A Little Help needed to walk in hospital room?: A Little Help needed climbing 3-5 steps with a railing? : A Lot 6 Click Score: 17    End of Session Equipment Utilized During Treatment: Gait belt Activity Tolerance: Patient tolerated treatment well Patient left: in bed;with bed alarm set Nurse Communication: Mobility status PT Visit Diagnosis: Other abnormalities of gait and mobility (R26.89);Muscle weakness (generalized) (M62.81);Pain Pain - Right/Left: Left Pain - part of body: Knee    Time: 0930-1000 PT Time Calculation (min) (ACUTE ONLY): 30 min   Charges:   PT Evaluation $PT Eval Moderate Complexity: 1 Mod PT Treatments $Gait Training: 8-22 mins $Therapeutic Activity: 8-22 mins        Iliany Losier, PT, GCS 11/29/18,10:14 AM

## 2018-11-29 NOTE — Progress Notes (Signed)
PB 155/118. Dr Posey Pronto made aware. Order for Hydralazine 10 mg IV received.

## 2018-11-29 NOTE — Progress Notes (Signed)
Clinical Social Worker (CSW) received SNF consult. PT is recommending home health. RN case manager aware of above. Please reconsult if future social work needs arise. CSW signing off.   Ladarryl Wrage, LCSW [phone] 

## 2018-11-29 NOTE — Progress Notes (Signed)
Informed patient to call nurse, aid or secretary when in need to use the restroom.  Informed patient not to mobilize with out staff member. Patient continues to get oob and out of chair without staff present. Patient also not using walker as instructed by PT. Moved patient to room closer to nurses station and informed staff that patient is a high fall risk. Patient placed in reclyner with chair alarm, call bell in reach, re-educted on calling staff for help with assistance anytime it is needed.

## 2018-11-29 NOTE — Progress Notes (Signed)
Patients husband in recliner sleeping. Patient states he has been sleeping for 2 days and not eating and he doesn't feel well.  He has been urinating in bucket next to chair. Informed patient and husband that he can not use the restroom in the room that way and if he is ill it is best for him to go home.. Patients brother in law came to visit and escorted patients husband out of the hospital.

## 2018-11-30 LAB — BASIC METABOLIC PANEL
Anion gap: 7 (ref 5–15)
BUN: 16 mg/dL (ref 6–20)
CO2: 24 mmol/L (ref 22–32)
Calcium: 8.4 mg/dL — ABNORMAL LOW (ref 8.9–10.3)
Chloride: 104 mmol/L (ref 98–111)
Creatinine, Ser: 1.05 mg/dL — ABNORMAL HIGH (ref 0.44–1.00)
GFR calc Af Amer: >60 mL/min (ref >60)
GFR calc non Af Amer: 60 mL/min — ABNORMAL LOW (ref >60)
Glucose, Bld: 116 mg/dL — ABNORMAL HIGH (ref 70–99)
Potassium: 3.8 mmol/L (ref 3.5–5.1)
Sodium: 135 mmol/L (ref 135–145)

## 2018-11-30 LAB — CBC
HCT: 35.2 % — ABNORMAL LOW (ref 36.0–46.0)
HEMOGLOBIN: 11.3 g/dL — AB (ref 12.0–15.0)
MCH: 36.3 pg — ABNORMAL HIGH (ref 26.0–34.0)
MCHC: 32.1 g/dL (ref 30.0–36.0)
MCV: 113.2 fL — ABNORMAL HIGH (ref 80.0–100.0)
Platelets: 214 10*3/uL (ref 150–400)
RBC: 3.11 MIL/uL — ABNORMAL LOW (ref 3.87–5.11)
RDW: 14.9 % (ref 11.5–15.5)
WBC: 7.3 10*3/uL (ref 4.0–10.5)
nRBC: 0 % (ref 0.0–0.2)

## 2018-11-30 MED ORDER — OXYCODONE HCL 5 MG PO TABS: 5.0000 mg | ORAL_TABLET | ORAL | 0 refills | Status: DC | PRN

## 2018-11-30 MED ORDER — ENOXAPARIN SODIUM 40 MG/0.4ML ~~LOC~~ SOLN: 40.0000 mg | SUBCUTANEOUS | 0 refills | Status: DC

## 2018-11-30 MED ORDER — METOPROLOL TARTRATE 25 MG PO TABS: 25.0000 mg | ORAL_TABLET | Freq: Two times a day (BID) | ORAL | 0 refills | Status: DC

## 2018-11-30 MED ORDER — TRAMADOL HCL 50 MG PO TABS: 50.0000 mg | ORAL_TABLET | Freq: Four times a day (QID) | ORAL | 0 refills | Status: DC | PRN

## 2018-11-30 MED ORDER — METOPROLOL TARTRATE 25 MG PO TABS
25.0000 mg | ORAL_TABLET | Freq: Two times a day (BID) | ORAL | Status: DC
  Administered 2018-11-30: 25 mg via ORAL
  Filled 2018-11-30: qty 1

## 2018-11-30 NOTE — Discharge Instructions (Signed)
Diet: As you were doing prior to hospitalization   Shower:  May shower but keep the wounds dry, use an occlusive plastic wrap, NO SOAKING IN TUB.  If the bandage gets wet, change with a clean dry gauze.  Dressing:  You may change your dressing as needed. Change the dressing with sterile gauze dressing.    Activity:  Increase activity slowly as tolerated, but follow the weight bearing instructions below.  No lifting or driving for 6 weeks.  Weight Bearing:   Weight bearing as tolerated to left lower extremity  Blood Clot Prevention: Take Lovenox daily for 14 days.  To prevent constipation: you may use a stool softener such as -  Colace (over the counter) 100 mg by mouth twice a day  Drink plenty of fluids (prune juice may be helpful) and high fiber foods Miralax (over the counter) for constipation as needed.    Itching:  If you experience itching with your medications, try taking only a single pain pill, or even half a pain pill at a time.  You may take up to 10 pain pills per day, and you can also use benadryl over the counter for itching or also to help with sleep.   Precautions:  If you experience chest pain or shortness of breath - call 911 immediately for transfer to the hospital emergency department!!  If you develop a fever greater that 101 F, purulent drainage from wound, increased redness or drainage from wound, or calf pain-Call [facility]                                              Follow- Up Appointment:  Please call for an appointment to be seen in 2 weeks at [place].

## 2018-11-30 NOTE — Anesthesia Postprocedure Evaluation (Signed)
Anesthesia Post Note  Patient: Aimee Buck  Procedure(s) Performed: TOTAL KNEE ARTHROPLASTY (Left Knee)  Patient location during evaluation: PACU Anesthesia Type: Spinal Level of consciousness: awake and alert Pain management: pain level controlled Vital Signs Assessment: post-procedure vital signs reviewed and stable Respiratory status: spontaneous breathing, nonlabored ventilation, respiratory function stable and patient connected to nasal cannula oxygen Cardiovascular status: blood pressure returned to baseline and stable Postop Assessment: no apparent nausea or vomiting Anesthetic complications: no     Last Vitals:  Vitals:   11/30/18 1131 11/30/18 1505  BP: (!) 148/98 (!) 140/95  Pulse: (!) 101 (!) 106  Resp:    Temp: 36.8 C 37.7 C  SpO2: 98% 95%    Last Pain:  Vitals:   11/30/18 1505  TempSrc: Oral  PainSc:                  Durenda Hurt

## 2018-11-30 NOTE — Progress Notes (Signed)
Subjective: 2 Days Post-Op Procedure(s) (LRB): TOTAL KNEE ARTHROPLASTY (Left) Patient reports pain as 7 on 0-10 scale.   Patient is well but hypertensive and tachycardic. HR 122 this AM and BP 160/90. Denies any headache, chest pain, SOB, dizziness or vision changes. Plan for discharge home today pending workup of tachycardia. Negative for chest pain and shortness of breath Fever: no Gastrointestinal:Negative for nausea and vomiting  Objective: Vital signs in last 24 hours: Temp:  [98.2 F (36.8 C)-99.6 F (37.6 C)] 98.9 F (37.2 C) (12/12 0804) Pulse Rate:  [105-122] 122 (12/12 0804) Resp:  [20] 20 (12/11 2343) BP: (133-182)/(90-123) 160/90 (12/12 0804) SpO2:  [97 %-99 %] 98 % (12/12 0804)  Intake/Output from previous day:  Intake/Output Summary (Last 24 hours) at 11/30/2018 0847 Last data filed at 11/30/2018 0558 Gross per 24 hour  Intake 240 ml  Output 400 ml  Net -160 ml    Intake/Output this shift: No intake/output data recorded.  Labs: Recent Labs    11/28/18 0931 11/29/18 0316  HGB 14.3 11.1*   Recent Labs    11/28/18 0931 11/29/18 0316  WBC  --  5.3  RBC  --  3.08*  HCT 42.0 34.4*  PLT  --  228   Recent Labs    11/29/18 0316 11/30/18 0447  NA 139 135  K 3.6 3.8  CL 108 104  CO2 25 24  BUN 16 16  CREATININE 1.32* 1.05*  GLUCOSE 112* 116*  CALCIUM 8.2* 8.4*   No results for input(s): LABPT, INR in the last 72 hours.   EXAM General - Patient is Alert, Appropriate and Oriented Extremity - ABD soft Sensation intact distally Intact pulses distally Dorsiflexion/Plantar flexion intact Incision: scant drainage No cellulitis present Dressing/Incision - blood tinged drainage, appears old. Motor Function - intact, moving foot and toes well on exam.  Abdomen soft on exam this AM, normal bowel sounds.  Past Medical History:  Diagnosis Date  . Asthma   . COPD (chronic obstructive pulmonary disease) ([place])   . Diabetes ([place])   . GERD  (gastroesophageal reflux disease)   . Hypertension     Assessment/Plan: 2 Days Post-Op Procedure(s) (LRB): TOTAL KNEE ARTHROPLASTY (Left) Active Problems:   Status post total knee replacement using cement, left  Estimated body mass index is 41.63 kg/m as calculated from the following:   Height as of this encounter: 5\' 6"  (1.676 m).   Weight as of this encounter: 117 kg. Advance diet Up with therapy D/C IV fluids   Labs reviewed this AM, CBC ordered for today. BP 160/90 and HR 122.  Denies any chest pain or SOB. WIll obtain EKG and internal medicine referral placed as the patient is already on hypertensive medication and has been taking pain medication regularly. Patient does have a documented history of alcohol abuse, possible withdrawal symptoms, on CIWA protocol. Pending results of work-up today will still plan on discharge home this afternoon. Up with PT today. Patient is passing gas and denies any abdominal pain. Possible discharge home this afternoon.  DVT Prophylaxis - Lovenox, Foot Pumps and TED hose Weight-Bearing as tolerated to left leg  J. Cameron Proud, PA-C [facility] Orthopaedic Surgery 11/30/2018, 8:47 AM

## 2018-11-30 NOTE — Progress Notes (Signed)
Physical Therapy Treatment Patient Details Name: Aimee Buck MRN: [id number] DOB: 1961/04/25 Today's Date: 11/30/2018    History of Present Illness Patient is a 57 year old female s/p left TKA. PMH to include asthma, COPD, DM, GERD, HTN    PT Comments    Chart reviewed.  HR 108 in supine at rest.  Participated in exercises as described below.  HR 110 after exercises.  Pt anxious to get up so limited exercises in supine today.  Pt to edge of bed with no assist but heavy use of rails and actually scoots to end of bed and uses footboard to assist.  Limited seated AROM today also.  Self limiting and was anxious to get up and walk.  Pt with poor hand placements to stand and also upon return to chair.  She pulls up on walker and does not reach back upon sitting.  She was able to walk 50' with steady HR remaining 120 or below.  She refused stair training stating she will have her 2 sons to help her home.  Has a wheelchair and they plan to carry her in in the chair.  Verbal review of steps pattern and she was able to voice process.  Continued to decline stairs despite encouragement.  Pt seemed generally frustrated and on the verge of tears during session.  Impulsive at times and self directing/limiting session.  Stated she wants to go home today and is anxious to do so.  Encouragement given.    Follow Up Recommendations  Home health PT     Equipment Recommendations  Rolling walker with 5" wheels    Recommendations for Other Services       Precautions / Restrictions Precautions Precautions: Fall Restrictions Weight Bearing Restrictions: Yes Other Position/Activity Restrictions: WBAT    Mobility  Bed Mobility Overal bed mobility: Modified Independent                Transfers Overall transfer level: Needs assistance Equipment used: Rolling walker (2 wheeled) Transfers: Sit to/from Stand Sit to Stand: Min guard;Modified independent (Device/Increase time)          General transfer comment: cues needed for safety with transfers, hand placement  Ambulation/Gait Ambulation/Gait assistance: Modified independent (Device/Increase time);Min guard Gait Distance (Feet): 50 Feet Assistive device: Rolling walker (2 wheeled) Gait Pattern/deviations: Step-to pattern;Step-through pattern Gait velocity: decreased   General Gait Details: varied.  cues to get up into walker as she keeps it too far out for effective use mose of the time.  Minimal correction by pt despite cues.   Stairs             Wheelchair Mobility    Modified Rankin (Stroke Patients Only)       Balance Overall balance assessment: Needs assistance;Mild deficits observed, not formally tested Sitting-balance support: No upper extremity supported Sitting balance-Leahy Scale: Good     Standing balance support: Bilateral upper extremity supported Standing balance-Leahy Scale: Fair                              Cognition Arousal/Alertness: Awake/alert Behavior During Therapy: WFL for tasks assessed/performed;Anxious;Impulsive Overall Cognitive Status: Within Functional Limits for tasks assessed                                        Exercises Total Joint Exercises Ankle Circles/Pumps: AROM;10 reps;Left Quad  Sets: AROM;10 reps;Left Heel Slides: AROM;10 reps;Left Hip ABduction/ADduction: AROM;10 reps;Left Straight Leg Raises: AROM;10 reps;Left;Supine Knee Flexion: AAROM;5 reps;Left Goniometric ROM: 5-55 - poor flexion today, limited by pain, self limiting.    General Comments        Pertinent Vitals/Pain Pain Assessment: 0-10 Pain Score: 8  Pain Location: knee  Pain Descriptors / Indicators: Aching;Discomfort;Operative site guarding Pain Intervention(s): Limited activity within patient's tolerance;Ice applied;Monitored during session    Home Living                      Prior Function            PT Goals (current goals can  now be found in the care plan section) Progress towards PT goals: Progressing toward goals    Frequency    BID      PT Plan Current plan remains appropriate    Co-evaluation              AM-PAC PT "6 Clicks" Mobility   Outcome Measure  Help needed turning from your back to your side while in a flat bed without using bedrails?: A Little Help needed moving from lying on your back to sitting on the side of a flat bed without using bedrails?: A Little Help needed moving to and from a bed to a chair (including a wheelchair)?: A Little Help needed standing up from a chair using your arms (e.g., wheelchair or bedside chair)?: A Little Help needed to walk in hospital room?: A Little Help needed climbing 3-5 steps with a railing? : A Lot 6 Click Score: 17    End of Session Equipment Utilized During Treatment: Gait belt Activity Tolerance: Patient limited by pain;Other (comment) Patient left: in chair;with call bell/phone within reach;with chair alarm set   Pain - Right/Left: Left Pain - part of body: Knee     Time: 1006-1030 PT Time Calculation (min) (ACUTE ONLY): 24 min  Charges:  $Gait Training: 8-22 mins $Therapeutic Exercise: 8-22 mins                     Chesley Noon, PTA 11/30/18, 10:39 AM

## 2018-11-30 NOTE — Discharge Summary (Signed)
Physician Discharge Summary  Patient ID: Aimee Buck MRN: [id number] DOB/AGE: 1961-05-17 57 y.o.  Admit date: 11/28/2018 Discharge date: 11/30/2018  Admission Diagnoses:  PRIMARY OSTEOARTHRITIS OF LEFT KNEE  Discharge Diagnoses: Patient Active Problem List   Diagnosis Date Noted  . Status post total knee replacement using cement, left 11/28/2018  . Pneumonia 03/05/2018  . Alcohol abuse   . Depression   . Pyelonephritis 03/09/2017  . Hyperglycemia 10/14/2016    Past Medical History:  Diagnosis Date  . Asthma   . COPD (chronic obstructive pulmonary disease) ([place])   . Diabetes ([place])   . GERD (gastroesophageal reflux disease)   . Hypertension     Transfusion: None.   Consultants (if any): Treatment Team:  Dillon Bjork, MD  Discharged Condition: Improved  Hospital Course: Aimee Buck is an 57 y.o. female who was admitted 11/28/2018 with a diagnosis of primary osteoarthritis of the left knee and went to the operating room on 11/28/2018 and underwent the above named procedures.   On POD2 the patient's HR was 122 and BP was 160/90.  Pt has had increased HR and elevated BP during hospital stay, O2 sats 98%, EKG was obtained and internal medicine consult was ordered.  Pt was started on Metoprolol 25mg  twice daily and was instructed to follow-up with PCP upon discharge from the hospital.  Per internal medicine stable for d/c home today with PCP follow-up.   Surgeries: Procedure(s): TOTAL KNEE ARTHROPLASTY on 11/28/2018 Patient tolerated the surgery well. Taken to PACU where she was stabilized and then transferred to the orthopedic floor.  Started on Lovenox 40mg  q 24 hrs. Foot pumps applied bilaterally at 80 mm. Heels elevated on bed with rolled towels. No evidence of DVT. Negative [place]. Physical therapy started on day #1 for gait training and transfer. OT started day #1 for ADL and assisted devices.  Patient's IV and foley were both removed on  POD1.  Implants: Left TKA using all-cemented [employer] Vanguard system with a 72.5 mm PCR femur, a 71 mm tibial tray with a 12 mm anterior stabilized e-poly insert, and a 34 x 8.5 mm all-poly 3-pegged domed patella.  She was given perioperative antibiotics:  Anti-infectives (From admission, onward)   Start     Dose/Rate Route Frequency Ordered Stop   11/28/18 1700  ceFAZolin (ANCEF) 3 g in dextrose 5 % 50 mL IVPB     3 g 100 mL/hr over 30 Minutes Intravenous Every 6 hours 11/28/18 1548 11/29/18 0552   11/28/18 1159  bacitracin 50,000 Units in sodium chloride 0.9 % 500 mL irrigation  Status:  Discontinued       As needed 11/28/18 1159 11/28/18 1317   11/28/18 0915  ceFAZolin (ANCEF) IVPB 2g/100 mL premix     2 g 200 mL/hr over 30 Minutes Intravenous  Once 11/28/18 0900 11/28/18 1140   11/28/18 0905  ceFAZolin (ANCEF) 2-4 GM/100ML-% IVPB  Status:  Discontinued    Note to Pharmacy:  Myles Lipps   : cabinet override      11/28/18 0905 11/28/18 0958   11/28/18 0856  ceFAZolin (ANCEF) 2-4 GM/100ML-% IVPB    Note to Pharmacy:  Myles Lipps   : cabinet override      11/28/18 0856 11/28/18 1110   11/28/18 0115  ceFAZolin (ANCEF) IVPB 2g/100 mL premix  Status:  Discontinued     2 g 200 mL/hr over 30 Minutes Intravenous  Once 11/28/18 0110 11/28/18 1529    .  She was given sequential compression devices,  early ambulation, and Lovenox for DVT prophylaxis.  She benefited maximally from the hospital stay and there were no complications.    Recent vital signs:  Vitals:   11/30/18 0804 11/30/18 1131  BP: (!) 160/90 (!) 148/98  Pulse: (!) 122 (!) 101  Resp:    Temp: 98.9 F (37.2 C) 98.3 F (36.8 C)  SpO2: 98% 98%    Recent laboratory studies:  Lab Results  Component Value Date   HGB 11.3 (L) 11/30/2018   HGB 11.1 (L) 11/29/2018   HGB 14.3 11/28/2018   Lab Results  Component Value Date   WBC 7.3 11/30/2018   PLT 214 11/30/2018   No results found for: INR Lab Results   Component Value Date   NA 135 11/30/2018   K 3.8 11/30/2018   CL 104 11/30/2018   CO2 24 11/30/2018   BUN 16 11/30/2018   CREATININE 1.05 (H) 11/30/2018   GLUCOSE 116 (H) 11/30/2018    Discharge Medications:   Allergies as of 11/30/2018      Reactions   Metrizamide Itching, Swelling   Previous contrast allergy as a young adult   Contrast Media [iodinated Diagnostic Agents] Itching, Swelling   Previous contrast allergy as a young adult      Medication List    TAKE these medications   amoxicillin-clavulanate 875-125 MG tablet Commonly known as:  AUGMENTIN Take 1 tablet by mouth every 12 (twelve) hours.   atorvastatin 40 MG tablet Commonly known as:  LIPITOR Take 1 tablet (40 mg total) by mouth daily at 6 PM.   enoxaparin 40 MG/0.4ML injection Commonly known as:  LOVENOX Inject 0.4 mLs (40 mg total) into the skin daily. Start taking on:  December 01, 2018   Fluticasone-Salmeterol 100-50 MCG/DOSE Aepb Commonly known as:  ADVAIR Inhale 1 puff into the lungs daily.   gabapentin 100 MG capsule Commonly known as:  NEURONTIN Take 2 capsules (200 mg total) by mouth 2 (two) times daily.   ibuprofen 800 MG tablet Commonly known as:  ADVIL,MOTRIN Take 800 mg by mouth every other day.   insulin aspart 100 UNIT/ML injection Commonly known as:  novoLOG Inject 15 Units into the skin 3 (three) times daily with meals.   insulin glargine 100 UNIT/ML injection Commonly known as:  LANTUS Inject 0.3 mLs (30 Units total) into the skin 2 (two) times daily.   lisinopril 10 MG tablet Commonly known as:  PRINIVIL,ZESTRIL Take 10 mg by mouth daily.   loperamide 2 MG tablet Commonly known as:  IMODIUM A-D Take 2 tablets (4 mg total) by mouth 4 (four) times daily as needed for diarrhea or loose stools.   Magnesium Oxide 240 MG Pack Commonly known as:  MAGNESIUM OXIDE 400 Take 400 mg by mouth 2 (two) times daily.   metoprolol tartrate 25 MG tablet Commonly known as:   LOPRESSOR Take 1 tablet (25 mg total) by mouth 2 (two) times daily.   naproxen 500 MG tablet Commonly known as:  NAPROSYN Take 1 tablet (500 mg total) by mouth 2 (two) times daily with a meal.   omeprazole 40 MG capsule Commonly known as:  PRILOSEC Take 40 mg by mouth daily.   ondansetron 4 MG disintegrating tablet Commonly known as:  ZOFRAN ODT Take 1 tablet (4 mg total) by mouth every 8 (eight) hours as needed for nausea or vomiting.   oxyCODONE 5 MG immediate release tablet Commonly known as:  Oxy IR/ROXICODONE Take 1-2 tablets (5-10 mg total) by mouth every 4 (four)  hours as needed for moderate pain (pain score 4-6).   potassium chloride SA 20 MEQ tablet Commonly known as:  K-DUR,KLOR-CON Take 1 tablet (20 mEq total) by mouth 2 (two) times daily.   traMADol 50 MG tablet Commonly known as:  ULTRAM Take 1-2 tablets (50-100 mg total) by mouth every 6 (six) hours as needed for moderate pain.            Durable Medical Equipment  (From admission, onward)         Start     Ordered   11/28/18 1549  DME Bedside commode  Once    Question:  Patient needs a bedside commode to treat with the following condition  Answer:  Status post total knee replacement using cement, left   11/28/18 1548   11/28/18 1549  DME 3 n 1  Once     11/28/18 1548   11/28/18 1549  DME Walker rolling  Once    Question:  Patient needs a walker to treat with the following condition  Answer:  Status post total knee replacement using cement, left   11/28/18 1548          Diagnostic Studies: Dg Chest 2 View  Result Date: 11/15/2018 CLINICAL DATA:  57 year old female preoperative study for knee surgery. Smoker. EXAM: CHEST - 2 VIEW COMPARISON:  Chest radiographs 06/03/2011. FINDINGS: Chronic cardiomegaly. Other mediastinal contours are within normal limits. Stable lung volumes since 2012. No pneumothorax or pleural effusion. No confluent pulmonary opacity. Bilateral pulmonary interstitial markings  appear increased since 2012. Visualized tracheal air column is within normal limits. No acute osseous abnormality identified. Negative visible bowel gas pattern. IMPRESSION: 1.  No acute cardiopulmonary abnormality. 2. Increased pulmonary interstitial markings since 2012 are probably smoking related. Electronically Signed   By: Genevie Ann M.D.   On: 11/15/2018 16:00   Dg Knee Left Port  Result Date: 11/28/2018 CLINICAL DATA:  Total left knee replacement. EXAM: PORTABLE LEFT KNEE - 1-2 VIEW COMPARISON:  07/05/2017. FINDINGS: Total left knee replacement.  Hardware intact.  Anatomic alignment. IMPRESSION: Total left knee replacement with anatomic alignment. Electronically Signed   By: Marcello Moores  Register   On: 11/28/2018 13:53   Disposition: Plan for discharge home today 11/30/18.  Follow-up Information    Lattie Corns, PA-C Follow up in 14 day(s).   Specialty:  Physician Assistant Why:  Electa Sniff information: [place] [id number] [phone]          Signed: Judson Roch PA-C 11/30/2018, 12:16 PM

## 2018-11-30 NOTE — Care Management Note (Signed)
Case Management Note  Patient Details  Name: Aimee Buck MRN: [id number] Date of Birth: 16-May-1961  Subjective/Objective:   Patient to be discharged per MD order. Orders in place for home health services. Previous RNCM had began workup for home health via [facility]. This is still patient preference, referral confirmed with Helene Kelp from [facility]. DME rolling walker obtained from [place] at [facility] care and is at bedside. Lovenox pricing confirmed for $3. Family to provide transport. No further needs.  Aimee Bloomer RN BSN RNCM [phone]                   Action/Plan:   Expected Discharge Date:  11/30/18               Expected Discharge Plan:  [place]  In-House Referral:     Discharge planning Services  CM Consult  Post Acute Care Choice:  Home Health, Durable Medical Equipment Choice offered to:  Patient  DME Arranged:  Walker rolling DME Agency:  [place]:  [place]:  [facility]  Status of Service:  Completed, signed off  If discussed at [employer] of Stay Meetings, dates discussed:    Additional Comments:  Aimee Buck A Avelyn Touch, RN 11/30/2018, 2:00 PM

## 2018-11-30 NOTE — Consult Note (Signed)
Medical Consultation  Aimee Buck:[id number] DOB: 02-19-1961 DOA: 11/28/2018 PCP: Gennette Pac, FNP   Requesting physician: dr Roland Rack Date of consultation: 11/30/2018 Reason for consultation: tachycardia  Impression/Recommendations 57 year old female with history of hypertension and tobacco dependence postoperative day #2 with asymptomatic tachycardia.  1.  Asymptomatic sinus tachycardia: Patient is complaining of pain and I feel that her tachycardia is most likely due to pain. I would control pain better She does not drink EtOH. Start metoprolol 25 twice daily CBC and BMP have remained relatively stable and I see no signs of acute blood loss anemia or dehydration. She is not hypoxic and therefore PE is less likely.  2.  Hypertension: Again this may be due to pain and if pain is better controlled her blood pressure and heart rate may be decreased. Can start metoprolol 25 mg p.o. twice daily and have close follow-up with her primary care physician. Patient may continue lisinopril 3.Tobacco dependence: Patient is encouraged to quit smoking. Counseling was provided for 4 minutes.   4.  Hyperlipidemia: Continue statin  Chief Complaint:  Patient would like to go home today  HPI:   57 year old female postoperative day #2 total knee arthroplasty.  Hospitalist was consulted for sinus tachycardia.  Patient denies chest pain, shortness of breath, palpitations, nausea or vomiting.  She continues to have pain postoperatively.  She denies EtOH abuse.  She does smoke cigarettes.  She denies wheezing, cough or fevers.  She denies shortness of breath, dizziness or lightheadedness.   Review of Systems  Constitutional: Negative for fever, chills weight loss HENT: Negative for ear pain, nosebleeds, congestion, facial swelling, rhinorrhea, neck pain, neck stiffness and ear discharge.   Respiratory: Negative for cough, shortness of breath, wheezing  Cardiovascular: Negative for chest  pain, palpitations and leg swelling.  Gastrointestinal: Negative for heartburn, abdominal pain, vomiting, diarrhea or consitpation Genitourinary: Negative for dysuria, urgency, frequency, hematuria Musculoskeletal: Negative for back pain or joint pain Neurological: Negative for dizziness, seizures, syncope, focal weakness,  numbness and headaches.  Hematological: Does not bruise/bleed easily.  Psychiatric/Behavioral: Negative for hallucinations, confusion, dysphoric mood   Past Medical History:  Diagnosis Date  . Asthma   . COPD (chronic obstructive pulmonary disease) ([place])   . Diabetes ([place])   . GERD (gastroesophageal reflux disease)   . Hypertension    Past Surgical History:  Procedure Laterality Date  . ANKLE SURGERY Left   . DENTAL SURGERY    . TOTAL KNEE ARTHROPLASTY Left 11/28/2018   Procedure: TOTAL KNEE ARTHROPLASTY;  Surgeon: Corky Mull, MD;  Location: [facility] ORS;  Service: Orthopedics;  Laterality: Left;   Social History:  reports that she has been smoking cigarettes. She has been smoking about 0.50 packs per day. She has never used smokeless tobacco. She reports current alcohol use. She reports that she does not use drugs.  Allergies  Allergen Reactions  . Metrizamide Itching and Swelling    Previous contrast allergy as a young adult   . Contrast Media [Iodinated Diagnostic Agents] Itching and Swelling    Previous contrast allergy as a young adult   Family History  Problem Relation Age of Onset  . Hypertension Mother   . Hypertension Father   . CAD Father   . Heart failure Father   . Breast cancer Neg Hx     Prior to Admission medications   Medication Sig Start Date End Date Taking? Authorizing Provider  Fluticasone-Salmeterol (ADVAIR) 100-50 MCG/DOSE AEPB Inhale 1 puff into the lungs daily.  Yes [provider]  ibuprofen (ADVIL,MOTRIN) 800 MG tablet Take 800 mg by mouth every other day.   Yes [provider]  lisinopril  (PRINIVIL,ZESTRIL) 10 MG tablet Take 10 mg by mouth daily.   Yes [provider]  omeprazole (PRILOSEC) 40 MG capsule Take 40 mg by mouth daily. 10/28/16  Yes [provider]  amoxicillin-clavulanate (AUGMENTIN) 875-125 MG tablet Take 1 tablet by mouth every 12 (twelve) hours. Patient not taking: Reported on 11/08/2018 03/06/18   Loletha Grayer, MD  atorvastatin (LIPITOR) 40 MG tablet Take 1 tablet (40 mg total) by mouth daily at 6 PM. Patient not taking: Reported on 11/08/2018 10/16/16   Demetrios Loll, MD  gabapentin (NEURONTIN) 100 MG capsule Take 2 capsules (200 mg total) by mouth 2 (two) times daily. Patient not taking: Reported on 11/08/2018 10/16/16   Demetrios Loll, MD  insulin aspart (NOVOLOG) 100 UNIT/ML injection Inject 15 Units into the skin 3 (three) times daily with meals. Patient not taking: Reported on 11/08/2018 10/16/16   Demetrios Loll, MD  insulin glargine (LANTUS) 100 UNIT/ML injection Inject 0.3 mLs (30 Units total) into the skin 2 (two) times daily. Patient not taking: Reported on 11/08/2018 10/16/16   Demetrios Loll, MD  loperamide (IMODIUM A-D) 2 MG tablet Take 2 tablets (4 mg total) by mouth 4 (four) times daily as needed for diarrhea or loose stools. Patient not taking: Reported on 11/08/2018 08/30/17   Carrie Mew, MD  Magnesium Oxide (MAGNESIUM OXIDE 400) 240 MG PACK Take 400 mg by mouth 2 (two) times daily. Patient not taking: Reported on 11/08/2018 03/06/18   Loletha Grayer, MD  naproxen (NAPROSYN) 500 MG tablet Take 1 tablet (500 mg total) by mouth 2 (two) times daily with a meal. Patient not taking: Reported on 11/08/2018 08/30/17   Carrie Mew, MD  ondansetron (ZOFRAN ODT) 4 MG disintegrating tablet Take 1 tablet (4 mg total) by mouth every 8 (eight) hours as needed for nausea or vomiting. Patient not taking: Reported on 11/08/2018 08/30/17   Carrie Mew, MD  potassium chloride SA (K-DUR,KLOR-CON) 20 MEQ tablet Take 1 tablet (20 mEq total) by  mouth 2 (two) times daily. Patient not taking: Reported on 11/08/2018 03/06/18   Loletha Grayer, MD    Physical Exam: Blood pressure (!) 160/90, pulse (!) 122, temperature 98.9 F (37.2 C), temperature source Oral, resp. rate 20, height 5\' 6"  (1.676 m), weight 117 kg, SpO2 98 %. @VITALS2 @ [employer]   11/28/18 0911  Weight: 117 kg    Intake/Output Summary (Last 24 hours) at 11/30/2018 1008 Last data filed at 11/30/2018 0925 Gross per 24 hour  Intake 360 ml  Output 500 ml  Net -140 ml     Constitutional: Appears well-developed and well-nourished. No distress. HENT: Normocephalic. Marland Kitchen Oropharynx is clear and moist.  Eyes: Conjunctivae and EOM are normal. PERRLA, no scleral icterus.  Neck: Normal ROM. Neck supple. No JVD. No tracheal deviation. CVS: RRR, S1/S2 +, no murmurs, no gallops, no carotid bruit.  Pulmonary: Effort and breath sounds normal, no stridor, rhonchi, wheezes, rales.  Abdominal: Soft. BS +,  no distension, tenderness, rebound or guarding.  Musculoskeletal: She is moving her foot and toes Incision with some drainage No edema and no tenderness.  Neuro: Alert. CN 2-12 grossly intact. No focal deficits. Skin: Skin is warm and dry. No rash noted. Psychiatric: Normal mood and affect.    Labs  Basic Metabolic Panel: Recent Labs  Lab 11/30/18 0447  NA 135  K 3.8  CL 104  CO2 24  GLUCOSE 116*  BUN 16  CREATININE 1.05*  CALCIUM 8.4*   Liver Function Tests: No results for input(s): AST, ALT, ALKPHOS, BILITOT, PROT, ALBUMIN in the last 168 hours. No results for input(s): LIPASE, AMYLASE in the last 168 hours.  CBC: Recent Labs  Lab 11/29/18 0316 11/30/18 0447  WBC 5.3 7.3  NEUTROABS 3.5  --   HGB 11.1* 11.3*  HCT 34.4* 35.2*  MCV 111.7* 113.2*  PLT 228 214   Cardiac Enzymes: No results for input(s): CKTOTAL, CKMB, CKMBINDEX, TROPONINI in the last 168 hours. BNP: Invalid input(s): POCBNP CBG: Recent Labs  Lab 11/29/18 1054  GLUCAP >600*     Radiological Exams: Dg Knee Left Port  Result Date: 11/28/2018 CLINICAL DATA:  Total left knee replacement. EXAM: PORTABLE LEFT KNEE - 1-2 VIEW COMPARISON:  07/05/2017. FINDINGS: Total left knee replacement.  Hardware intact.  Anatomic alignment. IMPRESSION: Total left knee replacement with anatomic alignment. Electronically Signed   By: Marcello Moores  Register   On: 11/28/2018 13:53       Thank you for allowing me to participate in the care of your patient. We will continue to follow.   Note: This dictation was prepared with Dragon dictation along with smaller phrase technology. Any transcriptional errors that result from this process are unintentional.  Time spent: 45 minutes  Aniyha Tate, MD    Patient may be discharged home with follow-up with her primary care physician.  Please make sure her pain is addressed.

## 2019-01-12 DIAGNOSIS — Z6841 Body Mass Index (BMI) 40.0 and over, adult: Secondary | ICD-10-CM | POA: Insufficient documentation

## 2019-03-14 ENCOUNTER — Ambulatory Visit: Admission: RE | Admit: 2019-03-14 | Payer: Medicaid Other | Source: Home / Self Care | Admitting: Internal Medicine

## 2019-03-14 ENCOUNTER — Encounter: Admission: RE | Payer: Self-pay | Source: Home / Self Care

## 2019-03-14 SURGERY — ESOPHAGOGASTRODUODENOSCOPY (EGD) WITH PROPOFOL
Anesthesia: General

## 2019-07-24 ENCOUNTER — Other Ambulatory Visit: Payer: Self-pay

## 2019-07-24 ENCOUNTER — Encounter
Admission: RE | Admit: 2019-07-24 | Discharge: 2019-07-24 | Disposition: A | Discharge status: Home / Self Care | Payer: Medicaid Other | Source: Ambulatory Visit | Attending: Surgery | Admitting: Surgery

## 2019-07-24 DIAGNOSIS — R000 Tachycardia, unspecified: Secondary | ICD-10-CM | POA: Diagnosis not present

## 2019-07-24 DIAGNOSIS — R9431 Abnormal electrocardiogram [ECG] [EKG]: Secondary | ICD-10-CM | POA: Insufficient documentation

## 2019-07-24 DIAGNOSIS — Z01818 Encounter for other preprocedural examination: Secondary | ICD-10-CM | POA: Diagnosis not present

## 2019-07-24 DIAGNOSIS — F17200 Nicotine dependence, unspecified, uncomplicated: Secondary | ICD-10-CM | POA: Insufficient documentation

## 2019-07-24 DIAGNOSIS — Z01810 Encounter for preprocedural cardiovascular examination: Secondary | ICD-10-CM

## 2019-07-24 DIAGNOSIS — I10 Essential (primary) hypertension: Secondary | ICD-10-CM | POA: Insufficient documentation

## 2019-07-24 DIAGNOSIS — M1711 Unilateral primary osteoarthritis, right knee: Secondary | ICD-10-CM | POA: Diagnosis not present

## 2019-07-24 LAB — URINALYSIS, ROUTINE W REFLEX MICROSCOPIC
Bacteria, UA: NONE SEEN
Bilirubin Urine: NEGATIVE
Glucose, UA: NEGATIVE mg/dL
Hgb urine dipstick: NEGATIVE
Ketones, ur: NEGATIVE mg/dL
Leukocytes,Ua: NEGATIVE
Nitrite: NEGATIVE
Protein, ur: 30 mg/dL — AB
Specific Gravity, Urine: 1.024 (ref 1.005–1.030)
pH: 5 (ref 5.0–8.0)

## 2019-07-24 LAB — BASIC METABOLIC PANEL
Anion gap: 12 (ref 5–15)
BUN: 28 mg/dL — ABNORMAL HIGH (ref 6–20)
CO2: 24 mmol/L (ref 22–32)
Calcium: 9 mg/dL (ref 8.9–10.3)
Chloride: 101 mmol/L (ref 98–111)
Creatinine, Ser: 2.07 mg/dL — ABNORMAL HIGH (ref 0.44–1.00)
GFR calc Af Amer: 30 mL/min — ABNORMAL LOW (ref >60)
GFR calc non Af Amer: 26 mL/min — ABNORMAL LOW (ref >60)
Glucose, Bld: 126 mg/dL — ABNORMAL HIGH (ref 70–99)
Potassium: 3.5 mmol/L (ref 3.5–5.1)
Sodium: 137 mmol/L (ref 135–145)

## 2019-07-24 LAB — TYPE AND SCREEN
ABO/RH(D): O POS
Antibody Screen: NEGATIVE

## 2019-07-24 LAB — CBC
HCT: 43 % (ref 36.0–46.0)
Hemoglobin: 14.1 g/dL (ref 12.0–15.0)
MCH: 33.8 pg (ref 26.0–34.0)
MCHC: 32.8 g/dL (ref 30.0–36.0)
MCV: 103.1 fL — ABNORMAL HIGH (ref 80.0–100.0)
Platelets: 228 10*3/uL (ref 150–400)
RBC: 4.17 MIL/uL (ref 3.87–5.11)
RDW: 14 % (ref 11.5–15.5)
WBC: 6.8 10*3/uL (ref 4.0–10.5)
nRBC: 0 % (ref 0.0–0.2)

## 2019-07-24 LAB — SURGICAL PCR SCREEN
MRSA, PCR: NEGATIVE
Staphylococcus aureus: NEGATIVE

## 2019-07-24 NOTE — Patient Instructions (Signed)
Your procedure is scheduled on: Tuesday 07/31/19 Report to [facility]. To find out your arrival time please call [phone] between 1PM - 3PM on Monday 07/30/19.  Remember: Instructions that are not followed completely may result in serious medical risk, up to and including death, or upon the discretion of your surgeon and anesthesiologist your surgery may need to be rescheduled.     _X__ 1. Do not eat food after midnight the night before your procedure.                 No gum chewing or hard candies. You may drink clear liquids up to 2 hours                 before you are scheduled to arrive for your surgery- DO not drink clear                 liquids within 2 hours of the start of your surgery.                 Clear Liquids include:  water, apple juice without pulp, clear carbohydrate                 drink such as Clearfast or Gatorade, Black Coffee or Tea (Do not add                 anything to coffee or tea). Diabetics water only  __X__2.  On the morning of surgery brush your teeth with toothpaste and water, you                 may rinse your mouth with mouthwash if you wish.  Do not swallow any              toothpaste of mouthwash.     _X__ 3.  No Alcohol for 24 hours before or after surgery.   _X__ 4.  Do Not Smoke or use e-cigarettes For 24 Hours Prior to Your Surgery.                 Do not use any chewable tobacco products for at least 6 hours prior to                 surgery.  ____  5.  Bring all medications with you on the day of surgery if instructed.   __X__  6.  Notify your doctor if there is any change in your medical condition      (cold, fever, infections).     Do not wear jewelry, make-up, hairpins, clips or nail polish. Do not wear lotions, powders, or perfumes.  Do not shave 48 hours prior to surgery. Men may shave face and neck. Do not bring valuables to the hospital.    [facility] is not responsible for  any belongings or valuables.  Contacts, dentures/partials or body piercings may not be worn into surgery. Bring a case for your contacts, glasses or hearing aids, a denture cup will be supplied. Leave your suitcase in the car. After surgery it may be brought to your room. For patients admitted to the hospital, discharge time is determined by your treatment team.   Patients discharged the day of surgery will not be allowed to drive home.   Please read over the following fact sheets that you were given:   MRSA Information  __X__ Take these medicines the morning of surgery with A SIP OF WATER:  1. allopurinol (ZYLOPRIM  2. omeprazole (PRILOSEC  3.   4.  5.  6.  ____ Fleet Enema (as directed)   __X__ Use CHG Soap/SAGE wipes as directed  __X__ Use inhalers on the day of surgery  ____ Stop metformin/Janumet/Farxiga 2 days prior to surgery    ____ Take 1/2 of usual insulin dose the night before surgery. No insulin the morning          of surgery.   ____ Stop Blood Thinners Coumadin/Plavix/Xarelto/Pleta/Pradaxa/Eliquis/Effient/Aspirin  on   Or contact your Surgeon, Cardiologist or Medical Doctor regarding  ability to stop your blood thinners  __X__ Stop Anti-inflammatories 7 days before surgery such as Advil, Ibuprofen, Motrin,  BC or Goodies Powder, Naprosyn, Naproxen, Aleve, Aspirin   Contact surgeon's office regarding ability to stop Aspirin  __X__ Stop all herbal supplements, fish oil or vitamin E until after surgery.  OK to continue your calcium  ____ Bring C-Pap to the hospital.

## 2019-07-24 NOTE — Pre-Procedure Instructions (Signed)
DECREASED RENAL FUNCTION/ LABS /REQUEST FOR CLEARANCE CALLED AND FAXED TO DR Roland Rack

## 2019-07-25 LAB — URINE CULTURE

## 2019-07-27 ENCOUNTER — Other Ambulatory Visit
Admission: RE | Admit: 2019-07-27 | Discharge: 2019-07-27 | Disposition: A | Discharge status: Home / Self Care | Payer: Medicaid Other | Source: Ambulatory Visit | Attending: Surgery | Admitting: Surgery

## 2019-07-27 ENCOUNTER — Other Ambulatory Visit: Payer: Self-pay

## 2019-07-27 DIAGNOSIS — Z20828 Contact with and (suspected) exposure to other viral communicable diseases: Secondary | ICD-10-CM | POA: Insufficient documentation

## 2019-07-27 LAB — SARS CORONAVIRUS 2 (TAT 6-24 HRS): SARS Coronavirus 2: NEGATIVE

## 2019-07-30 MED ORDER — CEFAZOLIN SODIUM-DEXTROSE 2-4 GM/100ML-% IV SOLN
2.0000 g | Freq: Once | INTRAVENOUS | Status: AC
  Administered 2019-07-31: 2 g via INTRAVENOUS

## 2019-07-30 NOTE — Pre-Procedure Instructions (Signed)
SPOKE WITH TIFFANY RE REAL CLEARANCE. DR POGGI HAD WANTED TO REPEAT LABS IN AM. EKG HAD BEEN FAXED TO PCP. SPOKE WITH DR Kayleen Memos WHO WANTS CLEARANCE RE RENAL STATUS AND EKG PRIOR TO SURGERY. NEW CLEARANCE REQUEST SHEET FAXED TO TIFFANY AT DR POGGI WITH LABS AND EKG.

## 2019-07-31 ENCOUNTER — Inpatient Hospital Stay: Payer: Medicaid Other

## 2019-07-31 ENCOUNTER — Other Ambulatory Visit: Payer: Self-pay

## 2019-07-31 ENCOUNTER — Inpatient Hospital Stay
Admission: RE | Admit: 2019-07-31 | Discharge: 2019-08-01 | DRG: 470 | Disposition: A | Discharge status: Home Health | Payer: Medicaid Other | Attending: Surgery | Admitting: Surgery

## 2019-07-31 ENCOUNTER — Inpatient Hospital Stay: Payer: Medicaid Other | Admitting: Anesthesiology

## 2019-07-31 ENCOUNTER — Encounter
Admission: RE | Disposition: A | Discharge status: Home Health | Payer: Self-pay | Source: Home / Self Care | Attending: Surgery

## 2019-07-31 ENCOUNTER — Encounter: Payer: Self-pay | Admitting: Surgery

## 2019-07-31 DIAGNOSIS — Z96652 Presence of left artificial knee joint: Secondary | ICD-10-CM | POA: Diagnosis present

## 2019-07-31 DIAGNOSIS — M1711 Unilateral primary osteoarthritis, right knee: Principal | ICD-10-CM | POA: Diagnosis present

## 2019-07-31 DIAGNOSIS — Z888 Allergy status to other drugs, medicaments and biological substances status: Secondary | ICD-10-CM

## 2019-07-31 DIAGNOSIS — J449 Chronic obstructive pulmonary disease, unspecified: Secondary | ICD-10-CM | POA: Diagnosis present

## 2019-07-31 DIAGNOSIS — Z886 Allergy status to analgesic agent status: Secondary | ICD-10-CM | POA: Diagnosis not present

## 2019-07-31 DIAGNOSIS — Z96651 Presence of right artificial knee joint: Secondary | ICD-10-CM

## 2019-07-31 DIAGNOSIS — E663 Overweight: Secondary | ICD-10-CM | POA: Diagnosis present

## 2019-07-31 DIAGNOSIS — Z6830 Body mass index (BMI) 30.0-30.9, adult: Secondary | ICD-10-CM | POA: Diagnosis not present

## 2019-07-31 DIAGNOSIS — E119 Type 2 diabetes mellitus without complications: Secondary | ICD-10-CM | POA: Diagnosis present

## 2019-07-31 DIAGNOSIS — Z833 Family history of diabetes mellitus: Secondary | ICD-10-CM | POA: Diagnosis not present

## 2019-07-31 DIAGNOSIS — K219 Gastro-esophageal reflux disease without esophagitis: Secondary | ICD-10-CM | POA: Diagnosis present

## 2019-07-31 DIAGNOSIS — F17200 Nicotine dependence, unspecified, uncomplicated: Secondary | ICD-10-CM | POA: Diagnosis present

## 2019-07-31 DIAGNOSIS — Z7951 Long term (current) use of inhaled steroids: Secondary | ICD-10-CM | POA: Diagnosis not present

## 2019-07-31 DIAGNOSIS — Z79899 Other long term (current) drug therapy: Secondary | ICD-10-CM | POA: Diagnosis not present

## 2019-07-31 DIAGNOSIS — I10 Essential (primary) hypertension: Secondary | ICD-10-CM | POA: Diagnosis present

## 2019-07-31 DIAGNOSIS — Z8249 Family history of ischemic heart disease and other diseases of the circulatory system: Secondary | ICD-10-CM

## 2019-07-31 DIAGNOSIS — Z91041 Radiographic dye allergy status: Secondary | ICD-10-CM

## 2019-07-31 HISTORY — PX: TOTAL KNEE ARTHROPLASTY: SHX125

## 2019-07-31 LAB — BASIC METABOLIC PANEL
Anion gap: 10 (ref 5–15)
BUN: 26 mg/dL — ABNORMAL HIGH (ref 6–20)
CO2: 23 mmol/L (ref 22–32)
Calcium: 10 mg/dL (ref 8.9–10.3)
Chloride: 102 mmol/L (ref 98–111)
Creatinine, Ser: 1.41 mg/dL — ABNORMAL HIGH (ref 0.44–1.00)
GFR calc Af Amer: 48 mL/min — ABNORMAL LOW (ref >60)
GFR calc non Af Amer: 42 mL/min — ABNORMAL LOW (ref >60)
Glucose, Bld: 119 mg/dL — ABNORMAL HIGH (ref 70–99)
Potassium: 4.2 mmol/L (ref 3.5–5.1)
Sodium: 135 mmol/L (ref 135–145)

## 2019-07-31 LAB — GLUCOSE, CAPILLARY
Glucose-Capillary: 126 mg/dL — ABNORMAL HIGH (ref 70–99)
Glucose-Capillary: 108 mg/dL — ABNORMAL HIGH (ref 70–99)

## 2019-07-31 LAB — URINE DRUG SCREEN, QUALITATIVE (ARMC ONLY)
Amphetamines, Ur Screen: NOT DETECTED
Barbiturates, Ur Screen: NOT DETECTED
Benzodiazepine, Ur Scrn: NOT DETECTED
Cannabinoid 50 Ng, Ur ~~LOC~~: NOT DETECTED
Cocaine Metabolite,Ur ~~LOC~~: NOT DETECTED
MDMA (Ecstasy)Ur Screen: NOT DETECTED
Methadone Scn, Ur: NOT DETECTED
Opiate, Ur Screen: NOT DETECTED
Phencyclidine (PCP) Ur S: NOT DETECTED
Tricyclic, Ur Screen: NOT DETECTED

## 2019-07-31 SURGERY — ARTHROPLASTY, KNEE, TOTAL
Anesthesia: Spinal | Site: Knee | Laterality: Right

## 2019-07-31 MED ORDER — TRANEXAMIC ACID 1000 MG/10ML IV SOLN
INTRAVENOUS | Status: DC | PRN
  Administered 2019-07-31: 1000 mg via TOPICAL

## 2019-07-31 MED ORDER — ACETAMINOPHEN 500 MG PO TABS
1000.0000 mg | ORAL_TABLET | Freq: Four times a day (QID) | ORAL | Status: AC
  Administered 2019-07-31 (×3): 1000 mg via ORAL
  Filled 2019-07-31 (×4): qty 2

## 2019-07-31 MED ORDER — PHENYLEPHRINE HCL (PRESSORS) 10 MG/ML IV SOLN
INTRAVENOUS | Status: AC
  Filled 2019-07-31: qty 1

## 2019-07-31 MED ORDER — KETAMINE HCL 50 MG/ML IJ SOLN
INTRAMUSCULAR | Status: DC | PRN
  Administered 2019-07-31 (×2): 15 mg via INTRAMUSCULAR
  Administered 2019-07-31: 10 mg via INTRAMUSCULAR
  Administered 2019-07-31 (×3): 15 mg via INTRAMUSCULAR
  Administered 2019-07-31: 10 mg via INTRAMUSCULAR

## 2019-07-31 MED ORDER — BISACODYL 10 MG RE SUPP: 10.0000 mg | Freq: Every day | RECTAL | Status: DC | PRN

## 2019-07-31 MED ORDER — SODIUM CHLORIDE 0.9 % IV SOLN
INTRAVENOUS | Status: DC
  Administered 2019-07-31: 07:00:00 via INTRAVENOUS

## 2019-07-31 MED ORDER — ACETAMINOPHEN 325 MG PO TABS: 325.0000 mg | ORAL_TABLET | Freq: Four times a day (QID) | ORAL | Status: DC | PRN

## 2019-07-31 MED ORDER — KETOROLAC TROMETHAMINE 15 MG/ML IJ SOLN
15.0000 mg | Freq: Four times a day (QID) | INTRAMUSCULAR | Status: AC
  Administered 2019-07-31 – 2019-08-01 (×4): 15 mg via INTRAVENOUS
  Filled 2019-07-31 (×4): qty 1

## 2019-07-31 MED ORDER — ONDANSETRON HCL 4 MG/2ML IJ SOLN
4.0000 mg | Freq: Four times a day (QID) | INTRAMUSCULAR | Status: DC | PRN
  Administered 2019-07-31: 4 mg via INTRAVENOUS
  Filled 2019-07-31: qty 2

## 2019-07-31 MED ORDER — MIDAZOLAM HCL 2 MG/2ML IJ SOLN
INTRAMUSCULAR | Status: AC
  Filled 2019-07-31: qty 2

## 2019-07-31 MED ORDER — ASPIRIN EC 81 MG PO TBEC
81.0000 mg | DELAYED_RELEASE_TABLET | Freq: Every day | ORAL | Status: DC
  Administered 2019-07-31 – 2019-08-01 (×2): 81 mg via ORAL
  Filled 2019-07-31 (×2): qty 1

## 2019-07-31 MED ORDER — ACETAMINOPHEN 10 MG/ML IV SOLN
INTRAVENOUS | Status: DC | PRN
  Administered 2019-07-31: 1000 mg via INTRAVENOUS

## 2019-07-31 MED ORDER — DIPHENHYDRAMINE HCL 12.5 MG/5ML PO ELIX: 12.5000 mg | ORAL_SOLUTION | ORAL | Status: DC | PRN

## 2019-07-31 MED ORDER — MOMETASONE FURO-FORMOTEROL FUM 100-5 MCG/ACT IN AERO
2.0000 | INHALATION_SPRAY | Freq: Two times a day (BID) | RESPIRATORY_TRACT | Status: DC
  Administered 2019-07-31 – 2019-08-01 (×2): 2 via RESPIRATORY_TRACT
  Filled 2019-07-31: qty 8.8

## 2019-07-31 MED ORDER — FENTANYL CITRATE (PF) 100 MCG/2ML IJ SOLN
INTRAMUSCULAR | Status: AC
  Filled 2019-07-31: qty 2

## 2019-07-31 MED ORDER — METOCLOPRAMIDE HCL 10 MG PO TABS: 5.0000 mg | ORAL_TABLET | Freq: Three times a day (TID) | ORAL | Status: DC | PRN

## 2019-07-31 MED ORDER — HYDROMORPHONE HCL 1 MG/ML IJ SOLN: 0.5000 mg | INTRAMUSCULAR | Status: DC | PRN

## 2019-07-31 MED ORDER — METOCLOPRAMIDE HCL 5 MG/ML IJ SOLN
5.0000 mg | Freq: Three times a day (TID) | INTRAMUSCULAR | Status: DC | PRN
  Administered 2019-07-31: 10 mg via INTRAVENOUS
  Filled 2019-07-31: qty 2

## 2019-07-31 MED ORDER — PROPOFOL 500 MG/50ML IV EMUL
INTRAVENOUS | Status: AC
  Filled 2019-07-31: qty 50

## 2019-07-31 MED ORDER — LISINOPRIL 20 MG PO TABS
20.0000 mg | ORAL_TABLET | Freq: Every day | ORAL | Status: DC
  Administered 2019-07-31 – 2019-08-01 (×2): 20 mg via ORAL
  Filled 2019-07-31 (×2): qty 1

## 2019-07-31 MED ORDER — HYDROCHLOROTHIAZIDE 12.5 MG PO CAPS
12.5000 mg | ORAL_CAPSULE | Freq: Every day | ORAL | Status: DC
  Administered 2019-07-31 – 2019-08-01 (×2): 12.5 mg via ORAL
  Filled 2019-07-31 (×2): qty 1

## 2019-07-31 MED ORDER — OXYCODONE HCL 5 MG PO TABS
5.0000 mg | ORAL_TABLET | ORAL | Status: DC | PRN
  Administered 2019-08-01: 10 mg via ORAL
  Administered 2019-08-01: 5 mg via ORAL
  Filled 2019-07-31: qty 2
  Filled 2019-07-31 (×2): qty 1

## 2019-07-31 MED ORDER — LORAZEPAM 1 MG PO TABS
1.0000 mg | ORAL_TABLET | Freq: Four times a day (QID) | ORAL | Status: DC | PRN
  Administered 2019-07-31: 1 mg via ORAL
  Filled 2019-07-31: qty 1

## 2019-07-31 MED ORDER — BUPIVACAINE HCL (PF) 0.5 % IJ SOLN
INTRAMUSCULAR | Status: AC
  Filled 2019-07-31: qty 10

## 2019-07-31 MED ORDER — FAMOTIDINE 20 MG PO TABS
20.0000 mg | ORAL_TABLET | Freq: Once | ORAL | Status: AC
  Administered 2019-07-31: 07:00:00 20 mg via ORAL

## 2019-07-31 MED ORDER — PROPOFOL 10 MG/ML IV BOLUS
INTRAVENOUS | Status: DC | PRN
  Administered 2019-07-31: 17 mg via INTRAVENOUS
  Administered 2019-07-31 (×2): 30 mg via INTRAVENOUS

## 2019-07-31 MED ORDER — MIDAZOLAM HCL 5 MG/5ML IJ SOLN
INTRAMUSCULAR | Status: DC | PRN
  Administered 2019-07-31 (×2): 2 mg via INTRAVENOUS

## 2019-07-31 MED ORDER — ALLOPURINOL 100 MG PO TABS
100.0000 mg | ORAL_TABLET | Freq: Every day | ORAL | Status: DC
  Administered 2019-08-01: 100 mg via ORAL
  Filled 2019-07-31: qty 1

## 2019-07-31 MED ORDER — CEFAZOLIN SODIUM-DEXTROSE 2-4 GM/100ML-% IV SOLN
2.0000 g | Freq: Four times a day (QID) | INTRAVENOUS | Status: AC
  Administered 2019-07-31 – 2019-08-01 (×3): 2 g via INTRAVENOUS
  Filled 2019-07-31 (×4): qty 100

## 2019-07-31 MED ORDER — SODIUM CHLORIDE 0.9 % IV SOLN
INTRAVENOUS | Status: DC | PRN
  Administered 2019-07-31: 60 mL

## 2019-07-31 MED ORDER — ONDANSETRON HCL 4 MG PO TABS: 4.0000 mg | ORAL_TABLET | Freq: Four times a day (QID) | ORAL | Status: DC | PRN

## 2019-07-31 MED ORDER — ENOXAPARIN SODIUM 40 MG/0.4ML ~~LOC~~ SOLN
40.0000 mg | SUBCUTANEOUS | Status: DC
  Administered 2019-08-01: 40 mg via SUBCUTANEOUS
  Filled 2019-07-31: qty 0.4

## 2019-07-31 MED ORDER — SODIUM CHLORIDE 0.9 % IV SOLN
INTRAVENOUS | Status: DC
  Administered 2019-07-31: 12:00:00 via INTRAVENOUS

## 2019-07-31 MED ORDER — CEFAZOLIN SODIUM-DEXTROSE 2-4 GM/100ML-% IV SOLN
INTRAVENOUS | Status: AC
  Filled 2019-07-31: qty 100

## 2019-07-31 MED ORDER — KETAMINE HCL 50 MG/ML IJ SOLN
INTRAMUSCULAR | Status: AC
  Filled 2019-07-31: qty 10

## 2019-07-31 MED ORDER — FAMOTIDINE 20 MG PO TABS
ORAL_TABLET | ORAL | Status: AC
  Filled 2019-07-31: qty 1

## 2019-07-31 MED ORDER — TRAMADOL HCL 50 MG PO TABS
50.0000 mg | ORAL_TABLET | Freq: Four times a day (QID) | ORAL | Status: DC | PRN
  Administered 2019-07-31 (×2): 50 mg via ORAL
  Filled 2019-07-31 (×2): qty 1

## 2019-07-31 MED ORDER — BUPIVACAINE HCL (PF) 0.5 % IJ SOLN
INTRAMUSCULAR | Status: DC | PRN
  Administered 2019-07-31: 3 mL

## 2019-07-31 MED ORDER — SODIUM CHLORIDE (PF) 0.9 % IJ SOLN
INTRAMUSCULAR | Status: AC
  Filled 2019-07-31: qty 200

## 2019-07-31 MED ORDER — DOCUSATE SODIUM 100 MG PO CAPS
100.0000 mg | ORAL_CAPSULE | Freq: Two times a day (BID) | ORAL | Status: DC
  Administered 2019-07-31 – 2019-08-01 (×2): 100 mg via ORAL
  Filled 2019-07-31 (×2): qty 1

## 2019-07-31 MED ORDER — HYDRALAZINE HCL 20 MG/ML IJ SOLN: 10.0000 mg | Freq: Four times a day (QID) | INTRAMUSCULAR | Status: DC | PRN

## 2019-07-31 MED ORDER — GLYCOPYRROLATE 0.2 MG/ML IJ SOLN
INTRAMUSCULAR | Status: DC | PRN
  Administered 2019-07-31: 0.2 mg via INTRAVENOUS

## 2019-07-31 MED ORDER — TRANEXAMIC ACID 1000 MG/10ML IV SOLN
INTRAVENOUS | Status: AC
  Filled 2019-07-31: qty 10

## 2019-07-31 MED ORDER — LIDOCAINE HCL (PF) 2 % IJ SOLN
INTRAMUSCULAR | Status: AC
  Filled 2019-07-31: qty 10

## 2019-07-31 MED ORDER — BUPIVACAINE-EPINEPHRINE (PF) 0.5% -1:200000 IJ SOLN
INTRAMUSCULAR | Status: DC | PRN
  Administered 2019-07-31: 30 mL

## 2019-07-31 MED ORDER — BUPIVACAINE-EPINEPHRINE (PF) 0.5% -1:200000 IJ SOLN
INTRAMUSCULAR | Status: AC
  Filled 2019-07-31: qty 30

## 2019-07-31 MED ORDER — FENTANYL CITRATE (PF) 100 MCG/2ML IJ SOLN: 25.0000 ug | INTRAMUSCULAR | Status: DC | PRN

## 2019-07-31 MED ORDER — BUPIVACAINE LIPOSOME 1.3 % IJ SUSP
INTRAMUSCULAR | Status: AC
  Filled 2019-07-31: qty 20

## 2019-07-31 MED ORDER — PROPOFOL 500 MG/50ML IV EMUL
INTRAVENOUS | Status: DC | PRN
  Administered 2019-07-31: 60 ug/kg/min via INTRAVENOUS

## 2019-07-31 MED ORDER — FENTANYL CITRATE (PF) 100 MCG/2ML IJ SOLN
INTRAMUSCULAR | Status: DC | PRN
  Administered 2019-07-31: 50 ug via INTRAVENOUS

## 2019-07-31 MED ORDER — SODIUM CHLORIDE 0.9 % IV SOLN
INTRAVENOUS | Status: DC | PRN
  Administered 2019-07-31: 15 ug/min via INTRAVENOUS

## 2019-07-31 MED ORDER — PANTOPRAZOLE SODIUM 40 MG PO TBEC
40.0000 mg | DELAYED_RELEASE_TABLET | Freq: Every day | ORAL | Status: DC
  Administered 2019-08-01: 40 mg via ORAL
  Filled 2019-07-31: qty 1

## 2019-07-31 MED ORDER — HYDROMORPHONE HCL 1 MG/ML IJ SOLN
0.2500 mg | INTRAMUSCULAR | Status: DC | PRN
  Administered 2019-07-31: 0.5 mg via INTRAVENOUS
  Filled 2019-07-31: qty 1

## 2019-07-31 MED ORDER — ONDANSETRON HCL 4 MG/2ML IJ SOLN: 4.0000 mg | Freq: Once | INTRAMUSCULAR | Status: DC | PRN

## 2019-07-31 MED ORDER — MAGNESIUM HYDROXIDE 400 MG/5ML PO SUSP: 30.0000 mL | Freq: Every day | ORAL | Status: DC | PRN

## 2019-07-31 MED ORDER — FLEET ENEMA 7-19 GM/118ML RE ENEM: 1.0000 | ENEMA | Freq: Once | RECTAL | Status: DC | PRN

## 2019-07-31 SURGICAL SUPPLY — 62 items
BANDAGE ELASTIC 6 LF NS (GAUZE/BANDAGES/DRESSINGS) ×3 IMPLANT
BLADE SAW SAG 25X90X1.19 (BLADE) ×3 IMPLANT
BLADE SURG SZ20 CARB STEEL (BLADE) ×3 IMPLANT
BONE VNGD MED NAIL (MISCELLANEOUS) ×3 IMPLANT
CANISTER SUCT 1200ML W/VALVE (MISCELLANEOUS) ×3 IMPLANT
CANISTER SUCT 3000ML PPV (MISCELLANEOUS) ×3 IMPLANT
CEMENT BONE R 1X40 (Cement) ×6 IMPLANT
CEMENT VACUUM MIXING SYSTEM (MISCELLANEOUS) ×3 IMPLANT
CHLORAPREP W/TINT 26 (MISCELLANEOUS) ×3 IMPLANT
COMP FEMORAL CRUC RIGHT 72.5 (Joint) ×3 IMPLANT
COMPONENT FEMRL CRUC RT 72.5 (Joint) ×1 IMPLANT
COOLER POLAR GLACIER W/PUMP (MISCELLANEOUS) ×3 IMPLANT
COVER MAYO STAND REUSABLE (DRAPES) ×3 IMPLANT
COVER WAND RF STERILE (DRAPES) ×3 IMPLANT
CUFF TOURN SGL QUICK 24 (TOURNIQUET CUFF)
CUFF TOURN SGL QUICK 30 (TOURNIQUET CUFF) ×2
CUFF TRNQT CYL 24X4X16.5-23 (TOURNIQUET CUFF) IMPLANT
CUFF TRNQT CYL 30X4X21-28X (TOURNIQUET CUFF) ×1 IMPLANT
DRAPE 3/4 80X56 (DRAPES) ×3 IMPLANT
DRAPE SPLIT 6X30 W/TAPE (DRAPES) ×3 IMPLANT
DRSG OPSITE POSTOP 4X10 (GAUZE/BANDAGES/DRESSINGS) ×3 IMPLANT
DRSG OPSITE POSTOP 4X8 (GAUZE/BANDAGES/DRESSINGS) ×3 IMPLANT
ELECT CAUTERY BLADE 6.4 (BLADE) ×3 IMPLANT
ELECT REM PT RETURN 9FT ADLT (ELECTROSURGICAL) ×3
ELECTRODE REM PT RTRN 9FT ADLT (ELECTROSURGICAL) ×1 IMPLANT
GLOVE BIO SURGEON STRL SZ7.5 (GLOVE) ×12 IMPLANT
GLOVE BIO SURGEON STRL SZ8 (GLOVE) ×12 IMPLANT
GLOVE BIOGEL PI IND STRL 8 (GLOVE) ×1 IMPLANT
GLOVE BIOGEL PI INDICATOR 8 (GLOVE) ×2
GLOVE INDICATOR 8.0 STRL GRN (GLOVE) ×3 IMPLANT
GOWN STRL REUS W/ TWL LRG LVL3 (GOWN DISPOSABLE) ×1 IMPLANT
GOWN STRL REUS W/ TWL XL LVL3 (GOWN DISPOSABLE) ×1 IMPLANT
GOWN STRL REUS W/TWL LRG LVL3 (GOWN DISPOSABLE) ×2
GOWN STRL REUS W/TWL XL LVL3 (GOWN DISPOSABLE) ×2
HOLDER FOLEY CATH W/STRAP (MISCELLANEOUS) ×6 IMPLANT
HOOD PEEL AWAY FLYTE STAYCOOL (MISCELLANEOUS) ×9 IMPLANT
INSERT TIB BEARING 71 10 (Insert) ×3 IMPLANT
KIT TURNOVER KIT A (KITS) ×3 IMPLANT
NDL SAFETY ECLIPSE 18X1.5 (NEEDLE) ×2 IMPLANT
NEEDLE HYPO 18GX1.5 SHARP (NEEDLE) ×4
NEEDLE SPNL 20GX3.5 QUINCKE YW (NEEDLE) ×3 IMPLANT
NS IRRIG 1000ML POUR BTL (IV SOLUTION) ×3 IMPLANT
PACK TOTAL KNEE (MISCELLANEOUS) ×3 IMPLANT
PAD WRAPON POLAR KNEE (MISCELLANEOUS) ×1 IMPLANT
PATELLA STD 34X8.5 (Orthopedic Implant) ×3 IMPLANT
PLATE KNEE TIBIAL 71MM FIXED (Plate) ×3 IMPLANT
PULSAVAC PLUS IRRIG FAN TIP (DISPOSABLE) ×3
SOL .9 NS 3000ML IRR  AL (IV SOLUTION) ×2
SOL .9 NS 3000ML IRR UROMATIC (IV SOLUTION) ×1 IMPLANT
STAPLER SKIN PROX 35W (STAPLE) ×3 IMPLANT
SUCTION FRAZIER HANDLE 10FR (MISCELLANEOUS) ×2
SUCTION TUBE FRAZIER 10FR DISP (MISCELLANEOUS) ×1 IMPLANT
SUT VIC AB 0 CT1 36 (SUTURE) ×9 IMPLANT
SUT VIC AB 2-0 CT1 27 (SUTURE) ×6
SUT VIC AB 2-0 CT1 TAPERPNT 27 (SUTURE) ×3 IMPLANT
SYR 10ML LL (SYRINGE) ×3 IMPLANT
SYR 20ML LL LF (SYRINGE) ×3 IMPLANT
SYR 30ML LL (SYRINGE) ×9 IMPLANT
TIP FAN IRRIG PULSAVAC PLUS (DISPOSABLE) ×1 IMPLANT
TRAY FOLEY MTR SLVR 16FR STAT (SET/KITS/TRAYS/PACK) ×3 IMPLANT
VANGUARD MEDIUM BONE NAIL ×3 IMPLANT
WRAPON POLAR PAD KNEE (MISCELLANEOUS) ×3

## 2019-07-31 NOTE — Anesthesia Post-op Follow-up Note (Signed)
Anesthesia QCDR form completed.        

## 2019-07-31 NOTE — Transfer of Care (Signed)
Immediate Anesthesia Transfer of Care Note  Patient: Aimee Buck  Procedure(s) Performed: RIGHT TOTAL KNEE ARTHROPLASTY (Right Knee)  Patient Location: PACU  Anesthesia Type:Spinal  Level of Consciousness: awake, alert  and oriented  Airway & Oxygen Therapy: Patient Spontanous Breathing and Patient connected to nasal cannula oxygen  Post-op Assessment: Report given to RN and Post -op Vital signs reviewed and stable  Post vital signs: Reviewed and stable  Last Vitals:  Vitals Value Taken Time  BP 110/74 07/31/19 1035  Temp    Pulse 96 07/31/19 1035  Resp 23 07/31/19 1035  SpO2 100 % 07/31/19 1035  Vitals shown include unvalidated device data.  Last Pain:  Vitals:   07/31/19 0631  TempSrc: Temporal  PainSc: 8          Complications: No apparent anesthesia complications

## 2019-07-31 NOTE — Anesthesia Procedure Notes (Addendum)
Spinal  Patient location during procedure: OR Start time: 07/31/2019 7:30 AM End time: 07/31/2019 7:47 AM Staffing Anesthesiologist: Emmie Niemann, MD Resident/CRNA: Bernardo Heater, CRNA Performed: anesthesiologist  Preanesthetic Checklist Completed: patient identified, site marked, surgical consent, pre-op evaluation, timeout performed, IV checked, risks and benefits discussed and monitors and equipment checked Spinal Block Patient position: sitting Prep: ChloraPrep Patient monitoring: heart rate, continuous pulse ox and blood pressure Approach: midline Location: L2-3 Injection technique: single-shot Needle Needle type: Introducer and Pencil-Tip  Needle gauge: 24 G Needle length: 9 cm Additional Notes Negative paresthesia. Negative blood return. Positive free-flowing CSF. Expiration date of kit checked and confirmed. Patient tolerated procedure well, without complications.

## 2019-07-31 NOTE — TOC Initial Note (Signed)
Transition of Care [facility]) - Initial/Assessment Note    Patient Details  Name: Aimee Buck MRN: [id number] Date of Birth: 1961-04-11  Transition of Care [facility]) CM/SW Contact:    Viral Schramm, Lenice Llamas Phone Number: [phone]  07/31/2019, 4:07 PM  Clinical Narrative: Clinical Social Worker (CSW) met with patient to discuss D/C plan. Patient was alert and oriented X3 and was sitting up in the bed. Patient was tearful and reported that she is in pain. RN is aware of above. CSW provided emotional support and introduced self. Patient reported that she lives in [place] with her husband Aimee Buck. Patient reported that she wants to go home with home health. CSW provided patient with CMS home health list and explained that her surgeon prefers [facility]. Patient is agreeable to [facility] home health and requested a rolling walker. Patient reported that she does not want or need a bedside commode. Per Helene Kelp [facility] home health representative they can accept patient. CSW made [employer] agency representative aware of the need for rolling walker. CSW made patient aware her Lovenox should be $3 under medicaid. Patient verbalized her understanding. CSW will continue to follow and assist as needed.                  Expected Discharge Plan: [place] Barriers to Discharge: Continued Medical Work up   Patient Goals and CMS Choice Patient states their goals for this hospitalization and ongoing recovery are:: Pain control CMS Medicare.gov Compare Post Acute Care list provided to:: Patient Choice offered to / list presented to : Patient  Expected Discharge Plan and Services Expected Discharge Plan: [place] In-house Referral: Clinical Social Work Discharge Planning Services: CM Consult Post Acute Care Choice: [place] arrangements for the past 2 months: Single Family Home                 DME Arranged: Walker rolling DME Agency: [employer] Date  DME Agency Contacted: 07/31/19 Time DME Agency Contacted: [phone] Representative spoke with at DME Agency: [place]: PT [place]: [facility] (formerly [employer]) Date [place]: 07/31/19 Time [place]: [place] Representative spoke with at [place] Arrangements/Services Living arrangements for the past 2 months: [place] with:: Spouse Patient language and need for interpreter reviewed:: No Do you feel safe going back to the place where you live?: Yes      Need for Family Participation in Patient Care: Yes (Comment) Care giver support system in place?: Yes (comment)   Criminal Activity/Legal Involvement Pertinent to Current Situation/Hospitalization: No - Comment as needed  Activities of Daily Living Home Assistive Devices/Equipment: Walker (specify type) ADL Screening (condition at time of admission) Patient's cognitive ability adequate to safely complete daily activities?: Yes Is the patient deaf or have difficulty hearing?: No Does the patient have difficulty seeing, even when wearing glasses/contacts?: No Does the patient have difficulty concentrating, remembering, or making decisions?: No Patient able to express need for assistance with ADLs?: Yes Does the patient have difficulty dressing or bathing?: No Independently performs ADLs?: Yes (appropriate for developmental age) Does the patient have difficulty walking or climbing stairs?: Yes Weakness of Legs: Both Weakness of Arms/Hands: None  Permission Sought/Granted Permission sought to share information with : ([place] and DME agency) Permission granted to share information with : Yes, Verbal Permission Granted     Permission granted to share info w AGENCY:  Home Health agency and DME agency        Emotional Assessment Appearance:: Appears stated age Attitude/Demeanor/Rapport: Crying(Patient was tearful due to pain.) Affect  (typically observed): Accepting Orientation: : Oriented to Self, Oriented to Place, Oriented to  Time, Oriented to Situation Alcohol / Substance Use: Not Applicable Psych Involvement: Outpatient Provider  Admission diagnosis:  Primary osteoarthritis of right knee. Patient Active Problem List   Diagnosis Date Noted  . Status post total knee replacement using cement, right 07/31/2019  . Status post total knee replacement using cement, left 11/28/2018  . Pneumonia 03/05/2018  . Alcohol abuse   . Depression   . Pyelonephritis 03/09/2017  . Hyperglycemia 10/14/2016   PCP:  Gennette Pac, [place] [facility]:   [place], [place] - [address] [place] [address] Phone: [phone] Fax: [phone]  [facility] [place], [place] - [address] [place] [address] Phone: [phone] Fax: [phone]     Social Determinants of Health (SDOH) Interventions    Readmission Risk Interventions No flowsheet data found.

## 2019-07-31 NOTE — Progress Notes (Signed)
Pt refused CPM machine.

## 2019-07-31 NOTE — Evaluation (Signed)
Physical Therapy Evaluation Patient Details Name: Aimee Buck MRN: [id number] DOB: 03-23-1961 Today's Date: 07/31/2019   History of Present Illness  Patient is 58 yo female s/p R TKA. PMH includes asthma, COPD, DM, GERD, HTN  Clinical Impression  Patient quickly sat up in bed at start of session, insistent that she needs her catheter removed. Pt educated on PT role and agreeable to mobilize to [facility] to communicate to the RN patient's ability to transfer. Pt extremely impulsive during session, often needed cues to decrease frustration and to await for physical therapist to prep in order to mobilize safely. Supine <> sit with supervision and use of bed rails. Sit <> stand with RW, (supervision, pt unwilling to wait for PT) first attempt NWB on RLE to transfer to bedside commode, second attempt to return to bed (with CGA) and side step towards head of bed with improved RLE weight bearing. no buckling noted. Further mobility held due to elevated BP. Pt repositioned for comfort in bed, and RN notified of pt complaints of pain and catheter.  Overall the patient demonstrated deficits (see "PT Problem List") that impede the patient's functional abilities, safety, and mobility and would benefit from skilled PT intervention. Recommendation is HHPT pending pt progress, pt insistent that she is leaving tomorrow.      Follow Up Recommendations Home health PT    Equipment Recommendations  Rolling walker with 5" wheels    Recommendations for Other Services       Precautions / Restrictions Precautions Precautions: Fall;Knee Precaution Booklet Issued: Yes (comment) Restrictions Weight Bearing Restrictions: Yes RLE Weight Bearing: Weight bearing as tolerated      Mobility  Bed Mobility Overal bed mobility: Needs Assistance Bed Mobility: Supine to Sit;Sit to Supine     Supine to sit: Supervision Sit to supine: Supervision      Transfers Overall transfer level: Needs assistance Equipment  used: Rolling walker (2 wheeled) Transfers: Sit to/from Stand Sit to Stand: Min guard            Ambulation/Gait Ambulation/Gait assistance: Min guard Gait Distance (Feet): 2 Feet Assistive device: Rolling walker (2 wheeled)       General Gait Details: First attempt NWB on RLE to transfer to bedside commode, second attempt to return to bed and side step towards head of bed with improved RLE weight bearing. no buckling noted. Pt very impulsive during session.  Stairs            Wheelchair Mobility    Modified Rankin (Stroke Patients Only)       Balance Overall balance assessment: Needs assistance Sitting-balance support: Feet supported Sitting balance-Leahy Scale: Good     Standing balance support: Bilateral upper extremity supported Standing balance-Leahy Scale: Fair                               Pertinent Vitals/Pain Pain Assessment: Faces Faces Pain Scale: Hurts whole lot Pain Location: R knee Pain Descriptors / Indicators: Grimacing;Nagging;Crying;Guarding Pain Intervention(s): Limited activity within patient's tolerance;Monitored during session;Repositioned;Ice applied;Patient requesting pain meds-RN notified    Home Living Family/patient expects to be discharged to:: Private residence Living Arrangements: Spouse/significant other;Children Available Help at Discharge: Family Type of Home: House Home Access: Stairs to enter Entrance Stairs-Rails: Can reach both;Left;Right Entrance Stairs-Number of Steps: 4 Home Layout: One level Home Equipment: None      Prior Function Level of Independence: Independent  Hand Dominance        Extremity/Trunk Assessment   Upper Extremity Assessment Upper Extremity Assessment: Overall WFL for tasks assessed    Lower Extremity Assessment Lower Extremity Assessment: Generalized weakness    Cervical / Trunk Assessment Cervical / Trunk Assessment: Normal  Communication    Communication: No difficulties  Cognition Arousal/Alertness: Awake/alert Behavior During Therapy: Agitated;Impulsive Overall Cognitive Status: Within Functional Limits for tasks assessed                                        General Comments      Exercises     Assessment/Plan    PT Assessment Patient needs continued PT services  PT Problem List Decreased strength;Decreased mobility;Decreased safety awareness;Decreased range of motion;Decreased knowledge of precautions;Decreased activity tolerance;Decreased balance;Pain;Decreased knowledge of use of DME       PT Treatment Interventions DME instruction;Therapeutic exercise;Gait training;Balance training;Stair training;Neuromuscular re-education;Functional mobility training;Therapeutic activities;Patient/family education    PT Goals (Current goals can be found in the Care Plan section)  Acute Rehab PT Goals Patient Stated Goal: to go home PT Goal Formulation: With patient Time For Goal Achievement: 08/14/19 Potential to Achieve Goals: Good    Frequency BID   Barriers to discharge        Co-evaluation               AM-PAC PT "6 Clicks" Mobility  Outcome Measure Help needed turning from your back to your side while in a flat bed without using bedrails?: A Little Help needed moving from lying on your back to sitting on the side of a flat bed without using bedrails?: A Little Help needed moving to and from a bed to a chair (including a wheelchair)?: A Little Help needed standing up from a chair using your arms (e.g., wheelchair or bedside chair)?: A Little Help needed to walk in hospital room?: A Little Help needed climbing 3-5 steps with a railing? : A Little 6 Click Score: 18    End of Session Equipment Utilized During Treatment: Gait belt Activity Tolerance: Treatment limited secondary to agitation;Treatment limited secondary to medical complications (Comment);Other (comment)(BP) Patient left: in  bed;with call bell/phone within reach;with bed alarm set Nurse Communication: Mobility status PT Visit Diagnosis: Other abnormalities of gait and mobility (R26.89);Muscle weakness (generalized) (M62.81);Pain;Difficulty in walking, not elsewhere classified (R26.2) Pain - Right/Left: Right Pain - part of body: Knee    Time: 1535-1600 PT Time Calculation (min) (ACUTE ONLY): 25 min   Charges:   PT Evaluation $PT Eval Low Complexity: 1 Low PT Treatments $Therapeutic Exercise: 8-22 mins        Lieutenant Diego PT, DPT 4:33 PM,07/31/19 [phone]

## 2019-07-31 NOTE — Anesthesia Preprocedure Evaluation (Signed)
Anesthesia Evaluation  Patient identified by MRN, date of birth, ID band Patient awake    Reviewed: Allergy & Precautions, NPO status , Patient's Chart, lab work & pertinent test results  History of Anesthesia Complications Negative for: history of anesthetic complications  Airway Mallampati: II  TM Distance: >3 FB Neck ROM: Full    Dental  (+) Poor Dentition   Pulmonary asthma , COPD,  COPD inhaler, Current Smoker and Patient abstained from smoking.,    breath sounds clear to auscultation- rhonchi (-) wheezing      Cardiovascular hypertension, (-) CAD, (-) Past MI, (-) Cardiac Stents and (-) CABG  Rhythm:Regular Rate:Normal - Systolic murmurs and - Diastolic murmurs    Neuro/Psych neg Seizures PSYCHIATRIC DISORDERS Depression negative neurological ROS     GI/Hepatic Neg liver ROS, GERD  ,  Endo/Other  diabetes  Renal/GU Renal InsufficiencyRenal disease     Musculoskeletal negative musculoskeletal ROS (+)   Abdominal (+) + obese,   Peds  Hematology negative hematology ROS (+)   Anesthesia Other Findings Past Medical History: No date: Asthma No date: COPD (chronic obstructive pulmonary disease) (HCC) No date: Diabetes ([place])     Comment:  diet controlled per pt 07/24/19 No date: GERD (gastroesophageal reflux disease) No date: Hypertension   Reproductive/Obstetrics                             Lab Results  Component Value Date   WBC 6.8 07/24/2019   HGB 14.1 07/24/2019   HCT 43.0 07/24/2019   MCV 103.1 (H) 07/24/2019   PLT 228 07/24/2019    Anesthesia Physical Anesthesia Plan  ASA: III  Anesthesia Plan: Spinal   Post-op Pain Management:    Induction:   PONV Risk Score and Plan: 1 and Propofol infusion and Ondansetron  Airway Management Planned: Natural Airway  Additional Equipment:   Intra-op Plan:   Post-operative Plan:   Informed Consent: I have reviewed the  patients History and Physical, chart, labs and discussed the procedure including the risks, benefits and alternatives for the proposed anesthesia with the patient or authorized representative who has indicated his/her understanding and acceptance.     Dental advisory given  Plan Discussed with: CRNA and Anesthesiologist  Anesthesia Plan Comments:         Anesthesia Quick Evaluation

## 2019-07-31 NOTE — H&P (Signed)
Paper H&P to be scanned into permanent record. H&P reviewed and patient re-examined. No changes. 

## 2019-07-31 NOTE — Op Note (Signed)
07/31/2019  10:38 AM  Patient:   Aimee Buck  Pre-Op Diagnosis:   Degenerative joint disease, right knee.  Post-Op Diagnosis:   Same  Procedure:   Right TKA using all-cemented [employer] Vanguard system with a 72.5 mm PCR femur, a 71 mm tibial tray with a 10 mm anterior stabilized E-poly insert, and a 34 x 8.5 mm all-poly 3-pegged domed patella.  Surgeon:   Pascal Lux, MD  Assistant:   Desiree Hane, RNFA; Freddie Apley, PA-S  Anesthesia:   Spinal  Findings:   As above  Complications:   None  EBL:   10 cc  Fluids:   1000 cc crystalloid  UOP:   550 cc  TT:   120 minutes at 300 mmHg  Drains:   None  Closure:   Staples  Implants:   As above  Brief Clinical Note:   The patient is a 58 year old female with a long history of progressively worsening right knee pain. The patient's symptoms have progressed despite medications, activity modification, injections, etc. The patient's history and examination were consistent with advanced degenerative joint disease of the right knee confirmed by plain radiographs. The patient presents at this time for a right total knee arthroplasty.  Procedure:   The patient was brought into the operating room. After adequate spinal anesthesia was obtained, the patient was lain in the supine position. A Foley catheter was placed by the nurse before the right lower extremity was prepped with ChloraPrep solution and draped sterilely. Preoperative antibiotics were administered. After verifying the proper laterality with a surgical timeout, the limb was exsanguinated with an Esmarch and the tourniquet inflated to 300 mmHg. A standard anterior approach to the knee was made through an approximately 7 inch incision. The incision was carried down through the subcutaneous tissues to expose superficial retinaculum. This was split the length of the incision and the medial flap elevated sufficiently to expose the medial retinaculum. The medial retinaculum was  incised, leaving a 3-4 mm cuff of tissue on the patella. This was extended distally along the medial border of the patellar tendon and proximally through the medial third of the quadriceps tendon. A subtotal fat pad excision was performed before the soft tissues were elevated off the anteromedial and anterolateral aspects of the proximal tibia to the level of the collateral ligaments. The anterior portions of the medial and lateral menisci were removed, as was the anterior cruciate ligament. With the knee flexed to 90, the external tibial guide was positioned and the appropriate proximal tibial cut made. This piece was taken to the back table where it was measured and found to be optimally replicated by a 71 mm component.  Attention was directed to the distal femur. The intramedullary canal was accessed through a 3/8" drill hole. The intramedullary guide was inserted and positioned in order to obtain a neutral flexion gap. The intercondylar block was positioned with care taken to avoid notching the anterior cortex of the femur. The appropriate cut was made. Next, the distal cutting block was placed at 6 of valgus alignment. Using the 9 mm slot, the distal cut was made. The distal femur was measured and found to be optimally replicated by the 16.1 mm component. The 72.5 mm 4-in-1 cutting block was positioned and first the posterior, then the posterior chamfer, the anterior chamfer, and finally the anterior cuts were made. At this point, the posterior portions medial and lateral menisci were removed. A trial reduction was performed using the appropriate femoral and tibial  components with the 10 mm insert. This demonstrated excellent stability to varus and valgus stressing both in flexion and extension while permitting full extension. Patella tracking was assessed and found to be excellent. Therefore, the tibial guide position was marked on the proximal tibia. The patella thickness was measured and found to be 23  mm. Therefore, the appropriate cut was made. The patellar surface was measured and found to be optimally replicated by the 34 mm component. The three peg holes were drilled in place before the trial button was inserted. Patella tracking was assessed and found to be excellent, passing the "no thumb test". The lug holes were drilled into the distal femur before the trial component was removed, leaving only the tibial tray. The keel was then created using the appropriate tower, reamer, and punch.  The bony surfaces were prepared for cementing by irrigating them thoroughly with bacitracin saline solution via the jet lavage system. A bone plug was fashioned from some of the bone that had been removed previously and used to plug the distal femoral canal. In addition, 20 cc of Exparel diluted out to 60 cc with normal saline and 30 cc of 0.5% Sensorcaine were injected into the postero-medial and postero-lateral aspects of the knee, the medial and lateral gutter regions, and the peri-incisional tissues to help with postoperative analgesia. Meanwhile, the cement was being mixed on the back table. When it was ready, the tibial tray was cemented in first. The excess cement was removed using [employer] fast streamer. Next, the femoral component was impacted into place. Again, the excess cement was removed using [employer] fast streamer. The 10 mm trial insert was positioned and the knee brought into extension while the cement hardened. Finally, the patella was cemented into place and secured using the patellar clamp. Again, the excess cement was removed using [employer] fast streamer. Once the cement had hardened, the knee was placed through a range of motion with the findings as described above. Therefore, the trial insert was removed and, after verifying that no cement had been retained posteriorly, the permanent 10 mm anterior stabilized E-polyethylene insert was positioned and secured using the appropriate key locking mechanism. Again the knee was  placed through a range of motion with the findings as described above.  The wound was copiously irrigated with bacitracin saline solution using the jet lavage system before the quadriceps tendon and retinacular layer were reapproximated using #0 Vicryl interrupted sutures. The superficial retinacular layer also was closed using a running #0 Vicryl suture. A total of 10 cc of transexemic acid (TXA) was injected intra-articularly before the subcutaneous tissues were closed in several layers using 2-0 Vicryl interrupted sutures. The skin was closed using staples. A sterile honeycomb dressing was applied to the skin before the leg was wrapped with an Ace wrap to accommodate the Polar Care device. The patient was then awakened and returned to the recovery room in satisfactory condition after tolerating the procedure well.

## 2019-08-01 LAB — CBC WITH DIFFERENTIAL/PLATELET
Abs Immature Granulocytes: 0.02 10*3/uL (ref 0.00–0.07)
Basophils Absolute: 0 10*3/uL (ref 0.0–0.1)
Basophils Relative: 1 %
Eosinophils Absolute: 0.2 10*3/uL (ref 0.0–0.5)
Eosinophils Relative: 4 %
HCT: 32.7 % — ABNORMAL LOW (ref 36.0–46.0)
Hemoglobin: 10.7 g/dL — ABNORMAL LOW (ref 12.0–15.0)
Immature Granulocytes: 0 %
Lymphocytes Relative: 25 %
Lymphs Abs: 1.4 10*3/uL (ref 0.7–4.0)
MCH: 33.9 pg (ref 26.0–34.0)
MCHC: 32.7 g/dL (ref 30.0–36.0)
MCV: 103.5 fL — ABNORMAL HIGH (ref 80.0–100.0)
Monocytes Absolute: 0.5 10*3/uL (ref 0.1–1.0)
Monocytes Relative: 10 %
Neutro Abs: 3.3 10*3/uL (ref 1.7–7.7)
Neutrophils Relative %: 60 %
Platelets: 195 10*3/uL (ref 150–400)
RBC: 3.16 MIL/uL — ABNORMAL LOW (ref 3.87–5.11)
RDW: 13.7 % (ref 11.5–15.5)
WBC: 5.4 10*3/uL (ref 4.0–10.5)
nRBC: 0 % (ref 0.0–0.2)

## 2019-08-01 LAB — BASIC METABOLIC PANEL
Anion gap: 7 (ref 5–15)
BUN: 25 mg/dL — ABNORMAL HIGH (ref 6–20)
CO2: 22 mmol/L (ref 22–32)
Calcium: 8.7 mg/dL — ABNORMAL LOW (ref 8.9–10.3)
Chloride: 108 mmol/L (ref 98–111)
Creatinine, Ser: 1.45 mg/dL — ABNORMAL HIGH (ref 0.44–1.00)
GFR calc Af Amer: 47 mL/min — ABNORMAL LOW (ref >60)
GFR calc non Af Amer: 40 mL/min — ABNORMAL LOW (ref >60)
Glucose, Bld: 127 mg/dL — ABNORMAL HIGH (ref 70–99)
Potassium: 4.3 mmol/L (ref 3.5–5.1)
Sodium: 137 mmol/L (ref 135–145)

## 2019-08-01 MED ORDER — ENOXAPARIN SODIUM 40 MG/0.4ML ~~LOC~~ SOLN: 40.0000 mg | SUBCUTANEOUS | 0 refills | Status: DC

## 2019-08-01 MED ORDER — DOCUSATE SODIUM 100 MG PO CAPS: 100.0000 mg | ORAL_CAPSULE | Freq: Two times a day (BID) | ORAL | 0 refills | Status: DC

## 2019-08-01 MED ORDER — OXYCODONE HCL 5 MG PO TABS: 5.0000 mg | ORAL_TABLET | ORAL | 0 refills | Status: AC | PRN

## 2019-08-01 NOTE — Discharge Instructions (Signed)

## 2019-08-01 NOTE — Progress Notes (Signed)
Physical Therapy Treatment Patient Details Name: Aimee Buck MRN: [id number] DOB: December 15, 1961 Today's Date: 08/01/2019    History of Present Illness Patient is 58 yo female s/p R TKA. PMH includes asthma, COPD, DM, GERD, HTN    PT Comments    Patient alert, eager to mobilize with PT so she can go home, in better spirits initially while laying in bed. BP assessed, 124/92. Pt performed quad sets with tactile cues and SLR x5 reps, decreased physical assist from PT each rep. Supine to sit with supervision and use of bed rails, sit <> stand from EOB and from standard commode, RW and CGA from bed and minA from low toilet to ensure safety. Pt ambulated ~149ft with RW and CGA. Pt relied heavily on UEs, educated on weight bearing status, step through gait pattern, and pacing of activity due to patients WOB and anxiety while ambulating. Overall steady, no buckling noted and improvement noted with step through pattern with time.  Pt tearful, expressed frustration and desire to go home. Pt up in chair at end of session, all needs in reach. Complaints of R knee pain of 3-4/10 during session. The patient would benefit from further skilled PT to continue to progress towards goals. Planning for stair training this afternoon.     Follow Up Recommendations  Home health PT     Equipment Recommendations  Rolling walker with 5" wheels    Recommendations for Other Services       Precautions / Restrictions Precautions Precautions: Fall;Knee Restrictions Weight Bearing Restrictions: Yes RLE Weight Bearing: Weight bearing as tolerated    Mobility  Bed Mobility Overal bed mobility: Needs Assistance Bed Mobility: Supine to Sit     Supine to sit: Supervision        Transfers Overall transfer level: Needs assistance Equipment used: Rolling walker (2 wheeled) Transfers: Sit to/from Stand Sit to Stand: Min guard;Min assist         General transfer comment: MinA for safe lowering to  standard commode  Ambulation/Gait Ambulation/Gait assistance: Min guard Gait Distance (Feet): 170 Feet Assistive device: Rolling walker (2 wheeled)       General Gait Details: Pt weight bearing heavily through UEs, educated on weight bearing status, step through gait pattern, and pacing of activity due to patients WOB and anxiety while ambulating. Overall steady, no buckling noted.   Stairs             Wheelchair Mobility    Modified Rankin (Stroke Patients Only)       Balance Overall balance assessment: Needs assistance Sitting-balance support: Feet supported Sitting balance-Leahy Scale: Good     Standing balance support: Bilateral upper extremity supported Standing balance-Leahy Scale: Fair                              Cognition Arousal/Alertness: Awake/alert Behavior During Therapy: Anxious Overall Cognitive Status: Within Functional Limits for tasks assessed                                        Exercises Total Joint Exercises Quad Sets: AROM;Right;10 reps Straight Leg Raises: AAROM;Strengthening;Right;5 reps Goniometric ROM: -3 - 70deg Other Exercises Other Exercises: Pt utilized grab bar and RW for standard commode transfer, heavily reliant on RW. minA for controlled lowering    General Comments        Pertinent Vitals/Pain  Pain Assessment: Faces Faces Pain Scale: Hurts a little bit Pain Location: R knee Pain Descriptors / Indicators: Grimacing;Nagging;Crying;Guarding Pain Intervention(s): Limited activity within patient's tolerance;Monitored during session;Repositioned;Ice applied    Home Living                      Prior Function            PT Goals (current goals can now be found in the care plan section) Progress towards PT goals: Progressing toward goals    Frequency    BID      PT Plan Current plan remains appropriate    Co-evaluation              AM-PAC PT "6 Clicks" Mobility    Outcome Measure  Help needed turning from your back to your side while in a flat bed without using bedrails?: A Little Help needed moving from lying on your back to sitting on the side of a flat bed without using bedrails?: A Little Help needed moving to and from a bed to a chair (including a wheelchair)?: A Little Help needed standing up from a chair using your arms (e.g., wheelchair or bedside chair)?: A Little Help needed to walk in hospital room?: A Little Help needed climbing 3-5 steps with a railing? : A Little 6 Click Score: 18    End of Session Equipment Utilized During Treatment: Gait belt Activity Tolerance: Patient tolerated treatment well Patient left: in chair;with chair alarm set;with call bell/phone within reach;with SCD's reapplied Nurse Communication: Mobility status PT Visit Diagnosis: Other abnormalities of gait and mobility (R26.89);Muscle weakness (generalized) (M62.81);Pain;Difficulty in walking, not elsewhere classified (R26.2) Pain - Right/Left: Right Pain - part of body: Knee     Time: 0835-0909 PT Time Calculation (min) (ACUTE ONLY): 34 min  Charges:  $Gait Training: 8-22 mins $Therapeutic Exercise: 8-22 mins                    Lieutenant Diego PT, DPT 10:32 AM,08/01/19 [phone]

## 2019-08-01 NOTE — Anesthesia Postprocedure Evaluation (Signed)
Anesthesia Post Note  Patient: Aimee Buck  Procedure(s) Performed: RIGHT TOTAL KNEE ARTHROPLASTY (Right Knee)  Patient location during evaluation: Nursing Unit Anesthesia Type: Spinal Level of consciousness: oriented, awake and alert and awake Pain management: pain level controlled Vital Signs Assessment: post-procedure vital signs reviewed and stable Respiratory status: spontaneous breathing and respiratory function stable Cardiovascular status: blood pressure returned to baseline and stable Postop Assessment: no headache, no backache, no apparent nausea or vomiting and patient able to bend at knees Anesthetic complications: no     Last Vitals:  Vitals:   07/31/19 2325 08/01/19 0433  BP: (!) 139/92 131/80  Pulse: 92 93  Resp:  20  Temp:  36.9 C  SpO2:  100%    Last Pain:  Vitals:   08/01/19 0433  TempSrc: Oral  PainSc:                  Caryl Asp

## 2019-08-01 NOTE — Progress Notes (Signed)
Physical Therapy Treatment Patient Details Name: Aimee Buck MRN: [id number] DOB: 1961-05-08 Today's Date: 08/01/2019    History of Present Illness Patient is 57 yo female s/p R TKA. PMH includes asthma, COPD, DM, GERD, HTN    PT Comments    Patient alert, tearful but eager to mobilize. Pt performed transfers from chair with CGA and RW, and from standard commode with minA to control descent and heavy use of UE. Pt ambulated to rehab gym with RW and CGA and performed stair navigation. Pt able to navigate stairs with CGA and constant verbal cues for proper sequencing of movements for safety. Pt reliant on double hand rails, but no overt LOB noted and no physical assistance needed. Pt did have several episodes of vomiting, RN notified. The patient would benefit from further skilled PT to continue to progress towards goals. Pt very eager to return home, but could benefit from further hospital care for pain control. However, pt did complete all functional activities needed, and completed them safely that could be discharged home today with MD approval.     Follow Up Recommendations  Home health PT     Equipment Recommendations  Rolling walker with 5" wheels    Recommendations for Other Services       Precautions / Restrictions Precautions Precautions: Fall;Knee Precaution Booklet Issued: No Restrictions Weight Bearing Restrictions: Yes RLE Weight Bearing: Weight bearing as tolerated    Mobility  Bed Mobility               General bed mobility comments: deferred up in chair  Transfers Overall transfer level: Needs assistance Equipment used: Rolling walker (2 wheeled) Transfers: Sit to/from Stand Sit to Stand: Min guard            Ambulation/Gait Ambulation/Gait assistance: Min guard Gait Distance (Feet): 170 Feet Assistive device: Rolling walker (2 wheeled)       General Gait Details: Pt reliant on walker, anxious and exhibited pain signs/symptoms while  ambulating.   Stairs Stairs: Yes Stairs assistance: Min guard Stair Management: Two rails Number of Stairs: 4 General stair comments: Pt able to perform without LOB, needed constant verbal cues for proper step to technique   Wheelchair Mobility    Modified Rankin (Stroke Patients Only)       Balance Overall balance assessment: Needs assistance Sitting-balance support: Feet supported Sitting balance-Leahy Scale: Good     Standing balance support: Bilateral upper extremity supported Standing balance-Leahy Scale: Fair                              Cognition Arousal/Alertness: Awake/alert Behavior During Therapy: Restless;Anxious Overall Cognitive Status: Within Functional Limits for tasks assessed                                        Exercises Other Exercises Other Exercises: Pt utilized standard commode with RW and MinA. Vomited while sitting on commode and when returning to chair, RN notified    General Comments        Pertinent Vitals/Pain Pain Assessment: Faces Faces Pain Scale: Hurts even more Pain Location: R knee Pain Descriptors / Indicators: Grimacing;Nagging;Crying;Guarding Pain Intervention(s): Limited activity within patient's tolerance;Monitored during session;Repositioned    Home Living                      Prior Function  PT Goals (current goals can now be found in the care plan section) Progress towards PT goals: Progressing toward goals    Frequency    BID      PT Plan Current plan remains appropriate    Co-evaluation              AM-PAC PT "6 Clicks" Mobility   Outcome Measure  Help needed turning from your back to your side while in a flat bed without using bedrails?: A Little Help needed moving from lying on your back to sitting on the side of a flat bed without using bedrails?: A Little Help needed moving to and from a bed to a chair (including a wheelchair)?: A  Little Help needed standing up from a chair using your arms (e.g., wheelchair or bedside chair)?: A Little Help needed to walk in hospital room?: A Little Help needed climbing 3-5 steps with a railing? : A Little 6 Click Score: 18    End of Session Equipment Utilized During Treatment: Gait belt Activity Tolerance: Patient tolerated treatment well Patient left: in chair;with chair alarm set;with call bell/phone within reach;with SCD's reapplied Nurse Communication: Mobility status PT Visit Diagnosis: Other abnormalities of gait and mobility (R26.89);Muscle weakness (generalized) (M62.81);Pain;Difficulty in walking, not elsewhere classified (R26.2) Pain - Right/Left: Right Pain - part of body: Knee     Time: 7858-8502 PT Time Calculation (min) (ACUTE ONLY): 24 min  Charges:  $Therapeutic Exercise: 23-37 mins                    Lieutenant Diego PT, DPT 4:24 PM,08/01/19 [phone]

## 2019-08-01 NOTE — TOC Transition Note (Signed)
Transition of Care [facility]) - CM/SW Discharge Note   Patient Details  Name: Aimee Buck MRN: [id number] Date of Birth: [birth year]  [facility] CM/SW Contact:  Su Duma, Lenice Llamas Phone Number: [phone]  08/01/2019, 2:29 PM   Clinical Narrative: Clinical Social Worker (CSW) notified Helene Kelp [facility] home health representative that patient will D/C home today. Patient's rolling walker has been delivered to the room. Patient is aware that her Lovenox should be $3 under medicaid. Please reconsult if future social work needs arise. CSW signing off.     Final next level of care: [place] Barriers to Discharge: Barriers Resolved   Patient Goals and CMS Choice Patient states their goals for this hospitalization and ongoing recovery are:: To go home. CMS Medicare.gov Compare Post Acute Care list provided to:: Patient Choice offered to / list presented to : Patient  Discharge Placement                       Discharge Plan and Services In-house Referral: Clinical Social Work Discharge Planning Services: CM Consult Post Acute Care Choice: Home Health          DME Arranged: Gilford Rile rolling DME Agency: [employer] Date DME Agency Contacted: 07/31/19 Time DME Agency Contacted: [phone] Representative spoke with at DME Agency: [place]: PT [place]: [facility] (formerly [employer]) Date [place]: 08/01/19 Time [place]: [place] Representative spoke with at [place]: [place] ([place]) Interventions     Readmission Risk Interventions No flowsheet data found.

## 2019-08-01 NOTE — Discharge Summary (Signed)
Physician Discharge Summary  Patient ID: Aimee Buck MRN: [id number] DOB/AGE: 56-Nov-1962 56 y.o.  Admit date: 07/31/2019 Discharge date: 08/01/2019  Admission Diagnoses:  Primary osteoarthritis of right knee.   Discharge Diagnoses: Patient Active Problem List   Diagnosis Date Noted  . Status post total knee replacement using cement, right 07/31/2019  . Status post total knee replacement using cement, left 11/28/2018  . Pneumonia 03/05/2018  . Alcohol abuse   . Depression   . Pyelonephritis 03/09/2017  . Hyperglycemia 10/14/2016    Past Medical History:  Diagnosis Date  . Asthma   . COPD (chronic obstructive pulmonary disease) ([place])   . Diabetes ([place])    diet controlled per pt 07/24/19  . GERD (gastroesophageal reflux disease)   . Hypertension      Transfusion: None   Consultants (if any):   Discharged Condition: Improved  Hospital Course: Aimee Buck is an 58 y.o. female who was admitted 07/31/2019 with a diagnosis of <principal problem not specified> and went to the operating room on 07/31/2019 and underwent the above named procedures.    Surgeries: Procedure(s): RIGHT TOTAL KNEE ARTHROPLASTY on 07/31/2019 Patient tolerated the surgery well. Taken to PACU where she was stabilized and then transferred to the orthopedic floor.  Started on Lovenox 40 mg q 24 hrs. Foot pumps applied bilaterally at 80 mm. Heels elevated on bed with rolled towels. No evidence of DVT. Negative [place]. Physical therapy started on day #1 for gait training and transfer. OT started day #1 for ADL and assisted devices.  Patient's foley was d/c on day #1. Patient's IV  was d/c on day #1.  On postop day 1 patient made excellent progress physical therapy ambulating around the nurses station 200 feet and able to tolerate steps safely.  She was stable and ready for discharge home  On post op day #1 patient was stable and ready for discharge to home with home health PT.  Implants: Right  TKA using all-cemented [employer] Vanguard system with a 72.5 mm PCR femur, a 71 mm tibial tray with a 10 mm anterior stabilized E-poly insert, and a 34 x 8.5 mm all-poly 3-pegged domed patella.  She was given perioperative antibiotics:  Anti-infectives (From admission, onward)   Start     Dose/Rate Route Frequency Ordered Stop   07/31/19 1400  ceFAZolin (ANCEF) IVPB 2g/100 mL premix     2 g 200 mL/hr over 30 Minutes Intravenous Every 6 hours 07/31/19 1141 08/01/19 0319   07/31/19 0640  ceFAZolin (ANCEF) 2-4 GM/100ML-% IVPB    Note to Pharmacy: Milinda Cave   : cabinet override      07/31/19 0640 07/31/19 0756   07/30/19 2215  ceFAZolin (ANCEF) IVPB 2g/100 mL premix     2 g 200 mL/hr over 30 Minutes Intravenous  Once 07/30/19 2208 07/31/19 0811    .  She was given sequential compression devices, early ambulation, and Lovenox, teds for DVT prophylaxis.  She benefited maximally from the hospital stay and there were no complications.    Recent vital signs:  Vitals:   08/01/19 0757 08/01/19 0837  BP: (!) 126/112 (!) 124/92  Pulse: 99   Resp: 17   Temp: 98.2 F (36.8 C)   SpO2: 100%     Recent laboratory studies:  Lab Results  Component Value Date   HGB 10.7 (L) 08/01/2019   HGB 14.1 07/24/2019   HGB 11.3 (L) 11/30/2018   Lab Results  Component Value Date   WBC 5.4 08/01/2019  PLT 195 08/01/2019   No results found for: INR Lab Results  Component Value Date   NA 137 08/01/2019   K 4.3 08/01/2019   CL 108 08/01/2019   CO2 22 08/01/2019   BUN 25 (H) 08/01/2019   CREATININE 1.45 (H) 08/01/2019   GLUCOSE 127 (H) 08/01/2019    Discharge Medications:   Allergies as of 08/01/2019      Reactions   Metrizamide Itching, Swelling   Previous contrast allergy as a young adult   Contrast Media [iodinated Diagnostic Agents] Itching, Swelling   Previous contrast allergy as a young adult      Medication List    TAKE these medications   allopurinol 100 MG tablet Commonly  known as: ZYLOPRIM Take 100 mg by mouth daily.   aspirin EC 81 MG tablet Take 81 mg by mouth daily.   CALCIUM PO Take 1 tablet by mouth daily.   docusate sodium 100 MG capsule Commonly known as: COLACE Take 1 capsule (100 mg total) by mouth 2 (two) times daily.   enoxaparin 40 MG/0.4ML injection Commonly known as: LOVENOX Inject 0.4 mLs (40 mg total) into the skin daily for 14 days. Start taking on: August 02, 2019   Fluticasone-Salmeterol 100-50 MCG/DOSE Aepb Commonly known as: ADVAIR Inhale 1 puff into the lungs daily as needed (shortness of breath).   hydrochlorothiazide 12.5 MG capsule Commonly known as: MICROZIDE Take 12.5 mg by mouth daily.   lisinopril 20 MG tablet Commonly known as: ZESTRIL Take 20 mg by mouth daily.   omeprazole 40 MG capsule Commonly known as: PRILOSEC Take 40 mg by mouth daily.   oxyCODONE 5 MG immediate release tablet Commonly known as: Oxy IR/ROXICODONE Take 1-2 tablets (5-10 mg total) by mouth every 4 (four) hours as needed for moderate pain (pain score 4-6).            Durable Medical Equipment  (From admission, onward)         Start     Ordered   07/31/19 1142  DME Bedside commode  Once    Question:  Patient needs a bedside commode to treat with the following condition  Answer:  Status post total knee replacement using cement, right   07/31/19 1141   07/31/19 1142  DME 3 n 1  Once     07/31/19 1141   07/31/19 1142  DME Walker rolling  Once    Question:  Patient needs a walker to treat with the following condition  Answer:  Status post total knee replacement using cement, right   07/31/19 1141          Diagnostic Studies: Dg Knee Right Port  Result Date: 07/31/2019 CLINICAL DATA:  Status post right knee replacement. EXAM: PORTABLE RIGHT KNEE - 1-2 VIEW COMPARISON:  Radiographs of July 05, 2017. FINDINGS: The right femoral and tibial components appear to be well situated. Expected postoperative changes are seen in the soft  tissues anteriorly. No fracture or dislocation is noted. IMPRESSION: Status post right total knee arthroplasty. Electronically Signed   By: Marijo Conception M.D.   On: 07/31/2019 12:54    Disposition: Discharge disposition: 01-Home or Self Care         Follow-up Information    Lattie Corns, PA-C Follow up in 2 week(s).   Specialty: Physician Assistant Contact information: [place] [address] [phone]            Signed: Feliberto Gottron 08/01/2019, 12:18 PM

## 2019-08-01 NOTE — Progress Notes (Signed)
   Subjective: 1 Day Post-Op Procedure(s) (LRB): RIGHT TOTAL KNEE ARTHROPLASTY (Right) Patient reports pain as mild.   Patient is well, and has had no acute complaints or problems Denies any CP, SOB, ABD pain. We will continue therapy today.  Plan is to go Home after hospital stay.  Objective: Vital signs in last 24 hours: Temp:  [97.6 F (36.4 C)-98.5 F (36.9 C)] 98.2 F (36.8 C) (08/12 0757) Pulse Rate:  [87-111] 99 (08/12 0757) Resp:  [17-22] 17 (08/12 0757) BP: (124-199)/(80-144) 124/92 (08/12 0837) SpO2:  [96 %-100 %] 100 % (08/12 0757)  Intake/Output from previous day: 08/11 0701 - 08/12 0700 In: 2161.6 [P.O.:555; I.V.:1306.6; IV Piggyback:300] Out: 1410 [Urine:1400; Blood:10] Intake/Output this shift: Total I/O In: 240 [P.O.:240] Out: -   Recent Labs    08/01/19 0421  HGB 10.7*   Recent Labs    08/01/19 0421  WBC 5.4  RBC 3.16*  HCT 32.7*  PLT 195   Recent Labs    07/31/19 0629 08/01/19 0421  NA 135 137  K 4.2 4.3  CL 102 108  CO2 23 22  BUN 26* 25*  CREATININE 1.41* 1.45*  GLUCOSE 119* 127*  CALCIUM 10.0 8.7*   No results for input(s): LABPT, INR in the last 72 hours.  EXAM General - Patient is Alert, Appropriate and Oriented Extremity - Neurovascular intact Sensation intact distally Intact pulses distally Dorsiflexion/Plantar flexion intact No cellulitis present Compartment soft Dressing - scant drainage Motor Function - intact, moving foot and toes well on exam.   Past Medical History:  Diagnosis Date  . Asthma   . COPD (chronic obstructive pulmonary disease) ([place])   . Diabetes ([place])    diet controlled per pt 07/24/19  . GERD (gastroesophageal reflux disease)   . Hypertension     Assessment/Plan:   1 Day Post-Op Procedure(s) (LRB): RIGHT TOTAL KNEE ARTHROPLASTY (Right) Active Problems:   Status post total knee replacement using cement, right  Estimated body mass index is 30.99 kg/m as calculated from the following:  Height as of 07/24/19: 5\' 6"  (1.676 m).   Weight as of 07/24/19: 87.1 kg. Advance diet Up with therapy, needs to complete steps this this afternoon.  Patient with excellent progress with PT this morning. Vital signs are stable and pain is well controlled. Discharge to home with home health PT today pending completion of PT goals   DVT Prophylaxis - Lovenox, Foot Pumps and TED hose Weight-Bearing as tolerated to right leg   T. Rachelle Hora, PA-C [place] 08/01/2019, 12:10 PM

## 2019-08-01 NOTE — Progress Notes (Signed)
Pt verbalized understanding of discharge instructions. Dressing was changed and IV discontinued prior to discharge. Pt is waiting for son to pick her up for home

## 2019-08-29 ENCOUNTER — Other Ambulatory Visit: Payer: Self-pay

## 2019-08-29 ENCOUNTER — Ambulatory Visit
Admission: RE | Admit: 2019-08-29 | Discharge: 2019-08-29 | Disposition: A | Discharge status: Home / Self Care | Payer: Medicaid Other | Source: Ambulatory Visit | Attending: Student | Admitting: Student

## 2019-08-29 ENCOUNTER — Other Ambulatory Visit: Payer: Self-pay | Admitting: Student

## 2019-08-29 ENCOUNTER — Other Ambulatory Visit
Admission: RE | Admit: 2019-08-29 | Discharge: 2019-08-29 | Disposition: A | Discharge status: Home / Self Care | Payer: Medicaid Other | Source: Ambulatory Visit | Attending: Sports Medicine | Admitting: Sports Medicine

## 2019-08-29 ENCOUNTER — Other Ambulatory Visit (HOSPITAL_COMMUNITY): Payer: Self-pay | Admitting: Student

## 2019-08-29 DIAGNOSIS — M7989 Other specified soft tissue disorders: Secondary | ICD-10-CM

## 2019-08-29 LAB — SYNOVIAL CELL COUNT + DIFF, W/ CRYSTALS
Crystals, Fluid: NONE SEEN
Eosinophils-Synovial: 0 %
Lymphocytes-Synovial Fld: 5 %
Monocyte-Macrophage-Synovial Fluid: 6 %
Neutrophil, Synovial: 89 %
WBC, Synovial: 3363 /mm3 — ABNORMAL HIGH (ref 0–200)

## 2019-09-12 ENCOUNTER — Encounter: Payer: Self-pay | Admitting: Family Medicine

## 2019-09-19 ENCOUNTER — Telehealth: Payer: Self-pay | Admitting: Gastroenterology

## 2019-09-19 NOTE — Telephone Encounter (Signed)
Returned patients call to schedule her colonoscopy.  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  Thanks Sharyn Lull

## 2019-09-19 NOTE — Telephone Encounter (Signed)
Pt left vm to schedule a procedure

## 2019-09-20 ENCOUNTER — Telehealth: Payer: Self-pay

## 2019-09-20 NOTE — Telephone Encounter (Signed)
Called patient back to schedule her colonoscopy.  Asked her to call me back when she is free today to schedule her colonoscopy.  Thanks [employer]

## 2019-09-21 ENCOUNTER — Telehealth: Payer: Self-pay | Admitting: Gastroenterology

## 2019-09-21 NOTE — Telephone Encounter (Signed)
LVM returning patients call to schedule colonoscopy.  Asked her to call me back to schedule.  Thanks [employer]

## 2019-09-21 NOTE — Telephone Encounter (Signed)
Patient is returning Michelle's call to schedule a colonoscopy.Please call before 11:00 if possible patient is going to work.

## 2019-09-26 ENCOUNTER — Encounter: Payer: Self-pay | Admitting: *Deleted

## 2019-10-01 ENCOUNTER — Telehealth: Payer: Self-pay | Admitting: Gastroenterology

## 2019-10-01 ENCOUNTER — Other Ambulatory Visit: Payer: Self-pay

## 2019-10-01 DIAGNOSIS — Z1211 Encounter for screening for malignant neoplasm of colon: Secondary | ICD-10-CM

## 2019-10-01 NOTE — Telephone Encounter (Signed)
Gastroenterology Pre-Procedure Review  Request Date: Friday 10/19/19 Requesting Physician: Dr. Vicente Males  PATIENT REVIEW QUESTIONS: The patient responded to the following health history questions as indicated:    1. Are you having any GI issues? no 2. Do you have a personal history of Polyps? no 3. Do you have a family history of Colon Cancer or Polyps? yes (mom colon polyps) 4. Diabetes Mellitus? no 5. Joint replacements in the past 12 months?knee replacement surgery 07/31/19 with Dr. Roland Rack 6. Major health problems in the past 3 months?no 7. Any artificial heart valves, MVP, or defibrillator?no    MEDICATIONS & ALLERGIES:    Patient reports the following regarding taking any anticoagulation/antiplatelet therapy:   Plavix, Coumadin, Eliquis, Xarelto, Lovenox, Pradaxa, Brilinta, or Effient? no Aspirin? yes (325 mg daily prescribed by Dr.Poggi-blood thinner request sent to Dr. Roland Rack)  Patient confirms/reports the following medications:  Current Outpatient Medications  Medication Sig Dispense Refill  . allopurinol (ZYLOPRIM) 100 MG tablet Take 100 mg by mouth daily.    Marland Kitchen aspirin EC 81 MG tablet Take 81 mg by mouth daily.    Marland Kitchen CALCIUM PO Take 1 tablet by mouth daily.    Marland Kitchen docusate sodium (COLACE) 100 MG capsule Take 1 capsule (100 mg total) by mouth 2 (two) times daily. 10 capsule 0  . enoxaparin (LOVENOX) 40 MG/0.4ML injection Inject 0.4 mLs (40 mg total) into the skin daily for 14 days. 5.4 mL 0  . Fluticasone-Salmeterol (ADVAIR) 100-50 MCG/DOSE AEPB Inhale 1 puff into the lungs daily as needed (shortness of breath).     . hydrochlorothiazide (MICROZIDE) 12.5 MG capsule Take 12.5 mg by mouth daily.    Marland Kitchen lisinopril (ZESTRIL) 20 MG tablet Take 20 mg by mouth daily.    Marland Kitchen omeprazole (PRILOSEC) 40 MG capsule Take 40 mg by mouth daily.    Marland Kitchen oxyCODONE (OXY IR/ROXICODONE) 5 MG immediate release tablet Take 1-2 tablets (5-10 mg total) by mouth every 4 (four) hours as needed for moderate pain (pain  score 4-6). 40 tablet 0   No current facility-administered medications for this visit.     Patient confirms/reports the following allergies:  Allergies  Allergen Reactions  . Metrizamide Itching and Swelling    Previous contrast allergy as a young adult   . Contrast Media [Iodinated Diagnostic Agents] Itching and Swelling    Previous contrast allergy as a young adult    No orders of the defined types were placed in this encounter.   AUTHORIZATION INFORMATION Primary Insurance: 1D#: Group #:  Secondary Insurance: 1D#: Group #:  SCHEDULE INFORMATION: Date: Friday 10/19/19  Time: Location:

## 2019-10-01 NOTE — Telephone Encounter (Signed)
Patient called & l/m a v/m stating she needs to scheduled a colonoscopy to called her to schedule. I returned her call to let her know Sharyn Lull was working in our [employer] office today & I would leave a message for her to return her call.

## 2019-10-04 ENCOUNTER — Other Ambulatory Visit: Payer: Self-pay | Admitting: Family Medicine

## 2019-10-04 DIAGNOSIS — R928 Other abnormal and inconclusive findings on diagnostic imaging of breast: Secondary | ICD-10-CM

## 2019-10-11 ENCOUNTER — Telehealth: Payer: Self-pay

## 2019-10-11 NOTE — Telephone Encounter (Signed)
Blood Thinner advice received from Dr. Roland Rack.  LVM for pt advising her that she may discontinue 325 mg Asprin at this time.  Thanks [employer]

## 2019-10-16 ENCOUNTER — Other Ambulatory Visit: Payer: Self-pay

## 2019-10-16 ENCOUNTER — Other Ambulatory Visit
Admission: RE | Admit: 2019-10-16 | Discharge: 2019-10-16 | Disposition: A | Discharge status: Home / Self Care | Payer: Medicaid Other | Source: Ambulatory Visit | Attending: Gastroenterology | Admitting: Gastroenterology

## 2019-10-16 DIAGNOSIS — Z20828 Contact with and (suspected) exposure to other viral communicable diseases: Secondary | ICD-10-CM | POA: Diagnosis not present

## 2019-10-16 DIAGNOSIS — Z01812 Encounter for preprocedural laboratory examination: Secondary | ICD-10-CM | POA: Insufficient documentation

## 2019-10-16 LAB — SARS CORONAVIRUS 2 (TAT 6-24 HRS): SARS Coronavirus 2: NEGATIVE

## 2019-10-19 ENCOUNTER — Ambulatory Visit
Admission: RE | Admit: 2019-10-19 | Discharge: 2019-10-19 | Disposition: A | Discharge status: Home / Self Care | Payer: Medicaid Other | Attending: Gastroenterology | Admitting: Gastroenterology

## 2019-10-19 ENCOUNTER — Ambulatory Visit: Payer: Medicaid Other | Admitting: Registered Nurse

## 2019-10-19 ENCOUNTER — Encounter
Admission: RE | Disposition: A | Discharge status: Home / Self Care | Payer: Self-pay | Source: Home / Self Care | Attending: Gastroenterology

## 2019-10-19 ENCOUNTER — Encounter: Payer: Self-pay | Admitting: *Deleted

## 2019-10-19 DIAGNOSIS — Z7901 Long term (current) use of anticoagulants: Secondary | ICD-10-CM | POA: Diagnosis not present

## 2019-10-19 DIAGNOSIS — K635 Polyp of colon: Secondary | ICD-10-CM | POA: Diagnosis not present

## 2019-10-19 DIAGNOSIS — Z1211 Encounter for screening for malignant neoplasm of colon: Secondary | ICD-10-CM | POA: Diagnosis present

## 2019-10-19 DIAGNOSIS — I10 Essential (primary) hypertension: Secondary | ICD-10-CM | POA: Diagnosis not present

## 2019-10-19 DIAGNOSIS — E119 Type 2 diabetes mellitus without complications: Secondary | ICD-10-CM | POA: Insufficient documentation

## 2019-10-19 DIAGNOSIS — D125 Benign neoplasm of sigmoid colon: Secondary | ICD-10-CM | POA: Diagnosis not present

## 2019-10-19 DIAGNOSIS — J449 Chronic obstructive pulmonary disease, unspecified: Secondary | ICD-10-CM | POA: Diagnosis not present

## 2019-10-19 DIAGNOSIS — Z7982 Long term (current) use of aspirin: Secondary | ICD-10-CM | POA: Diagnosis not present

## 2019-10-19 DIAGNOSIS — D122 Benign neoplasm of ascending colon: Secondary | ICD-10-CM | POA: Diagnosis not present

## 2019-10-19 DIAGNOSIS — F17210 Nicotine dependence, cigarettes, uncomplicated: Secondary | ICD-10-CM | POA: Insufficient documentation

## 2019-10-19 DIAGNOSIS — K219 Gastro-esophageal reflux disease without esophagitis: Secondary | ICD-10-CM | POA: Insufficient documentation

## 2019-10-19 DIAGNOSIS — Z96653 Presence of artificial knee joint, bilateral: Secondary | ICD-10-CM | POA: Insufficient documentation

## 2019-10-19 HISTORY — PX: COLONOSCOPY WITH PROPOFOL: SHX5780

## 2019-10-19 LAB — GLUCOSE, CAPILLARY: Glucose-Capillary: 97 mg/dL (ref 70–99)

## 2019-10-19 SURGERY — COLONOSCOPY WITH PROPOFOL
Anesthesia: General

## 2019-10-19 MED ORDER — SODIUM CHLORIDE 0.9 % IV SOLN
INTRAVENOUS | Status: DC
  Administered 2019-10-19: 09:00:00 via INTRAVENOUS

## 2019-10-19 MED ORDER — PROPOFOL 10 MG/ML IV BOLUS
INTRAVENOUS | Status: DC | PRN
  Administered 2019-10-19: 70 mg via INTRAVENOUS

## 2019-10-19 MED ORDER — PROPOFOL 500 MG/50ML IV EMUL
INTRAVENOUS | Status: DC | PRN
  Administered 2019-10-19: 150 ug/kg/min via INTRAVENOUS

## 2019-10-19 NOTE — Anesthesia Preprocedure Evaluation (Signed)
Anesthesia Evaluation  Patient identified by MRN, date of birth, ID band Patient awake    Reviewed: Allergy & Precautions, NPO status , Patient's Chart, lab work & pertinent test results  History of Anesthesia Complications Negative for: history of anesthetic complications  Airway Mallampati: II  TM Distance: >3 FB Neck ROM: Full    Dental  (+) Poor Dentition, Chipped   Pulmonary asthma , COPD,  COPD inhaler, Current SmokerPatient did not abstain from smoking.,    breath sounds clear to auscultation- rhonchi (-) wheezing      Cardiovascular hypertension, Pt. on medications (-) CAD, (-) Past MI, (-) Cardiac Stents and (-) CABG  Rhythm:Regular Rate:Normal - Systolic murmurs and - Diastolic murmurs    Neuro/Psych neg Seizures PSYCHIATRIC DISORDERS Depression negative neurological ROS     GI/Hepatic Neg liver ROS, GERD  ,  Endo/Other  diabetes  Renal/GU negative Renal ROS     Musculoskeletal negative musculoskeletal ROS (+)   Abdominal (+) + obese,   Peds  Hematology negative hematology ROS (+)   Anesthesia Other Findings Past Medical History: No date: Asthma No date: COPD (chronic obstructive pulmonary disease) (HCC) No date: Diabetes ([place])     Comment:  diet controlled per pt 07/24/19 No date: GERD (gastroesophageal reflux disease) No date: Hypertension   Reproductive/Obstetrics                             Anesthesia Physical Anesthesia Plan  ASA: III  Anesthesia Plan: General   Post-op Pain Management:    Induction: Intravenous  PONV Risk Score and Plan: 1 and Propofol infusion  Airway Management Planned: Natural Airway  Additional Equipment:   Intra-op Plan:   Post-operative Plan:   Informed Consent: I have reviewed the patients History and Physical, chart, labs and discussed the procedure including the risks, benefits and alternatives for the proposed anesthesia with the  patient or authorized representative who has indicated his/her understanding and acceptance.     Dental advisory given  Plan Discussed with: CRNA and Anesthesiologist  Anesthesia Plan Comments:         Anesthesia Quick Evaluation

## 2019-10-19 NOTE — Op Note (Signed)
[facility] Gastroenterology Patient Name: Aimee Buck Procedure Date: 10/19/2019 9:00 AM MRN: [id number] Account #: [id number] Date of Birth: 1961-01-17 Admit Type: Outpatient Age: 58 Room: [facility] ENDO ROOM 4 Gender: Female Note Status: Finalized Procedure:            Colonoscopy Indications:          Screening for colorectal malignant neoplasm Providers:            Jonathon Bellows MD, MD Referring MD:         No Local Md, MD (Referring MD) Medicines:            Monitored Anesthesia Care Complications:        No immediate complications. Procedure:            Pre-Anesthesia Assessment:                       - Prior to the procedure, a History and Physical was                        performed, and patient medications, allergies and                        sensitivities were reviewed. The patient's tolerance of                        previous anesthesia was reviewed.                       - The risks and benefits of the procedure and the                        sedation options and risks were discussed with the                        patient. All questions were answered and informed                        consent was obtained.                       - ASA Grade Assessment: II - A patient with mild                        systemic disease.                       After obtaining informed consent, the colonoscope was                        passed under direct vision. Throughout the procedure,                        the patient's blood pressure, pulse, and oxygen                        saturations were monitored continuously. The                        Colonoscope was introduced through the anus and  advanced to the the cecum, identified by the                        appendiceal orifice. The colonoscopy was performed with                        ease. The patient tolerated the procedure well. The                        quality of the bowel preparation  was good. Findings:      The perianal and digital rectal examinations were normal.      A 6 mm polyp was found in the ascending colon. The polyp was sessile.       The polyp was removed with a cold snare. Resection and retrieval were       complete.      Six sessile polyps were found in the ascending colon. The polyps were 4       to 6 mm in size. These polyps were removed with a cold snare. Resection       and retrieval were complete.      The exam was otherwise without abnormality on direct and retroflexion       views. Impression:           - One 6 mm polyp in the ascending colon, removed with a                        cold snare. Resected and retrieved.                       - Six 4 to 6 mm polyps in the ascending colon, removed                        with a cold snare. Resected and retrieved.                       - The examination was otherwise normal on direct and                        retroflexion views. Recommendation:       - Discharge patient to home (with escort).                       - Resume previous diet.                       - Continue present medications.                       - Await pathology results.                       - Repeat colonoscopy for surveillance based on                        pathology results. Procedure Code(s):    --- Professional ---                       [phone], Colonoscopy, flexible; with removal of tumor(s),  polyp(s), or other lesion(s) by snare technique Diagnosis Code(s):    --- Professional ---                       K63.5, Polyp of colon                       Z12.11, Encounter for screening for malignant neoplasm                        of colon CPT copyright 2019 American Medical Association. All rights reserved. The codes documented in this report are preliminary and upon coder review may  be revised to meet current compliance requirements. Jonathon Bellows, MD Jonathon Bellows MD, MD 10/19/2019 9:19:59 AM This report has  been signed electronically. Number of Addenda: 0 Note Initiated On: 10/19/2019 9:00 AM Scope Withdrawal Time: 0 hours 12 minutes 33 seconds  Total Procedure Duration: 0 hours 14 minutes 34 seconds  Estimated Blood Loss: Estimated blood loss: none.      [facility]

## 2019-10-19 NOTE — Transfer of Care (Signed)
Immediate Anesthesia Transfer of Care Note  Patient: Aimee Buck [facility]  Procedure(s) Performed: COLONOSCOPY WITH PROPOFOL (N/A )  Patient Location: PACU  Anesthesia Type:General  Level of Consciousness: sedated  Airway & Oxygen Therapy: Patient Spontanous Breathing  Post-op Assessment: Report given to RN and Post -op Vital signs reviewed and stable  Post vital signs: Reviewed and stable  Last Vitals:  Vitals Value Taken Time  BP 97/67 10/19/19 0922  Temp 36.4 C 10/19/19 0922  Pulse 86 10/19/19 0922  Resp 18 10/19/19 0922  SpO2 97 % 10/19/19 0922    Last Pain:  Vitals:   10/19/19 0829  TempSrc: Tympanic  PainSc: 0-No pain         Complications: No apparent anesthesia complications

## 2019-10-19 NOTE — H&P (Signed)
Aimee Bellows, MD [address] [place], [place], [place], [address] [address], [place], [place], [place], [address] Phone: [phone]  Fax: [phone]  Primary Care Physician:  Gennette Pac, FNP   Pre-Procedure History & Physical: HPI:  Aimee Buck is a 58 y.o. female is here for an colonoscopy.   Past Medical History:  Diagnosis Date  . Asthma   . COPD (chronic obstructive pulmonary disease) ([place])   . Diabetes ([place])    diet controlled per pt 07/24/19  . GERD (gastroesophageal reflux disease)   . Hypertension     Past Surgical History:  Procedure Laterality Date  . ANKLE SURGERY Left   . DENTAL SURGERY    . JOINT REPLACEMENT Left 10/2018  . TOTAL KNEE ARTHROPLASTY Left 11/28/2018   Procedure: TOTAL KNEE ARTHROPLASTY;  Surgeon: Corky Mull, MD;  Location: [facility] ORS;  Service: Orthopedics;  Laterality: Left;  . TOTAL KNEE ARTHROPLASTY Right 07/31/2019   Procedure: RIGHT TOTAL KNEE ARTHROPLASTY;  Surgeon: Corky Mull, MD;  Location: [facility] ORS;  Service: Orthopedics;  Laterality: Right;    Prior to Admission medications   Medication Sig Start Date End Date Taking? Authorizing Provider  allopurinol (ZYLOPRIM) 100 MG tablet Take 100 mg by mouth daily.   Yes [provider]  aspirin EC 81 MG tablet Take 81 mg by mouth daily.   Yes [provider]  CALCIUM PO Take 1 tablet by mouth daily.   Yes [provider]  Fluticasone-Salmeterol (ADVAIR) 100-50 MCG/DOSE AEPB Inhale 1 puff into the lungs daily as needed (shortness of breath).    Yes [provider]  hydrochlorothiazide (MICROZIDE) 12.5 MG capsule Take 12.5 mg by mouth daily.   Yes [provider]  lisinopril (ZESTRIL) 20 MG tablet Take 20 mg by mouth daily.   Yes [provider]  omeprazole (PRILOSEC) 40 MG capsule Take 40 mg by mouth daily. 10/28/16  Yes [provider]  docusate sodium (COLACE) 100 MG capsule Take 1 capsule (100 mg total)  by mouth 2 (two) times daily. 08/01/19   Duanne Guess, PA-C  enoxaparin (LOVENOX) 40 MG/0.4ML injection Inject 0.4 mLs (40 mg total) into the skin daily for 14 days. 08/02/19 08/16/19  Duanne Guess, PA-C  oxyCODONE (OXY IR/ROXICODONE) 5 MG immediate release tablet Take 1-2 tablets (5-10 mg total) by mouth every 4 (four) hours as needed for moderate pain (pain score 4-6). 08/01/19   Duanne Guess, PA-C    Allergies as of 10/01/2019 - Review Complete 07/31/2019  Allergen Reaction Noted  . Metrizamide Itching and Swelling 03/09/2017  . Contrast media [iodinated diagnostic agents] Itching and Swelling 03/09/2017    Family History  Problem Relation Age of Onset  . Hypertension Mother   . Hypertension Father   . CAD Father   . Heart failure Father   . Breast cancer Neg Hx     Social History   Socioeconomic History  . Marital status: Married    Spouse name: Not on file  . Number of children: Not on file  . Years of education: Not on file  . Highest education level: Not on file  Occupational History  . Not on file  Social Needs  . Financial resource strain: Not on file  . Food insecurity    Worry: Not on file    Inability: Not on file  . Transportation needs    Medical: Not on file    Non-medical: Not on file  Tobacco  Use  . Smoking status: Current Every Day Smoker    Packs/day: 0.50    Types: Cigarettes  . Smokeless tobacco: Never Used  . Tobacco comment: patient trying to quit smoking   Substance and Sexual Activity  . Alcohol use: Yes    Comment: a few shots  . Drug use: No  . Sexual activity: Yes    Birth control/protection: None  Lifestyle  . Physical activity    Days per week: Not on file    Minutes per session: Not on file  . Stress: Not on file  Relationships  . Social Herbalist on phone: Not on file    Gets together: Not on file    Attends religious service: Not on file    Active member of club or organization: Not on file    Attends  meetings of clubs or organizations: Not on file    Relationship status: Not on file  . Intimate partner violence    Fear of current or ex partner: Not on file    Emotionally abused: Not on file    Physically abused: Not on file    Forced sexual activity: Not on file  Other Topics Concern  . Not on file  Social History Narrative   Lives at home with husband    Review of Systems: See HPI, otherwise negative ROS  Physical Exam: BP (!) 131/99   Pulse 87   Temp (!) 97 F (36.1 C) (Tympanic)   Resp 18   Ht 5\' 6"  (1.676 m)   Wt 109.8 kg   SpO2 99%   BMI 39.06 kg/m  General:   Alert,  pleasant and cooperative in NAD Head:  Normocephalic and atraumatic. Neck:  Supple; no masses or thyromegaly. Lungs:  Clear throughout to auscultation, normal respiratory effort.    Heart:  +S1, +S2, Regular rate and rhythm, No edema. Abdomen:  Soft, nontender and nondistended. Normal bowel sounds, without guarding, and without rebound.   Neurologic:  Alert and  oriented x4;  grossly normal neurologically.  Impression/Plan: [name] is here for an colonoscopy to be performed for Screening colonoscopy average risk   Risks, benefits, limitations, and alternatives regarding  colonoscopy have been reviewed with the patient.  Questions have been answered.  All parties agreeable.   Aimee Bellows, MD  10/19/2019, 8:43 AM

## 2019-10-19 NOTE — Anesthesia Post-op Follow-up Note (Signed)
Anesthesia QCDR form completed.        

## 2019-10-19 NOTE — Anesthesia Postprocedure Evaluation (Signed)
Anesthesia Post Note  Patient: Aimee Buck [facility]  Procedure(s) Performed: COLONOSCOPY WITH PROPOFOL (N/A )  Patient location during evaluation: Endoscopy Anesthesia Type: General Level of consciousness: awake and alert and oriented Pain management: pain level controlled Vital Signs Assessment: post-procedure vital signs reviewed and stable Respiratory status: spontaneous breathing, nonlabored ventilation and respiratory function stable Cardiovascular status: blood pressure returned to baseline and stable Postop Assessment: no signs of nausea or vomiting Anesthetic complications: no     Last Vitals:  Vitals:   10/19/19 0922 10/19/19 0932  BP: 97/67 (!) 126/97  Pulse: 86 92  Resp: 18 11  Temp: (!) 36.4 C   SpO2: 97% 98%    Last Pain:  Vitals:   10/19/19 0932  TempSrc:   PainSc: 0-No pain                 [name]

## 2019-10-22 ENCOUNTER — Encounter: Payer: Self-pay | Admitting: Gastroenterology

## 2019-10-22 LAB — SURGICAL PATHOLOGY

## 2019-10-30 ENCOUNTER — Encounter: Payer: Self-pay | Admitting: Gastroenterology

## 2019-11-21 ENCOUNTER — Ambulatory Visit
Admission: RE | Admit: 2019-11-21 | Discharge: 2019-11-21 | Disposition: A | Discharge status: Home / Self Care | Payer: Medicaid Other | Source: Ambulatory Visit | Attending: Family Medicine | Admitting: Family Medicine

## 2019-11-21 DIAGNOSIS — R928 Other abnormal and inconclusive findings on diagnostic imaging of breast: Secondary | ICD-10-CM | POA: Diagnosis not present

## 2019-12-20 IMAGING — DX PORTABLE RIGHT KNEE - 1-2 VIEW
2 series · 2 of 2 positions shown · non-contrast
Comparison: Radiographs July 05, 2017.

CLINICAL DATA: Status post right knee replacement.

EXAM:
PORTABLE RIGHT KNEE - 1-2 VIEW

[knee ap]
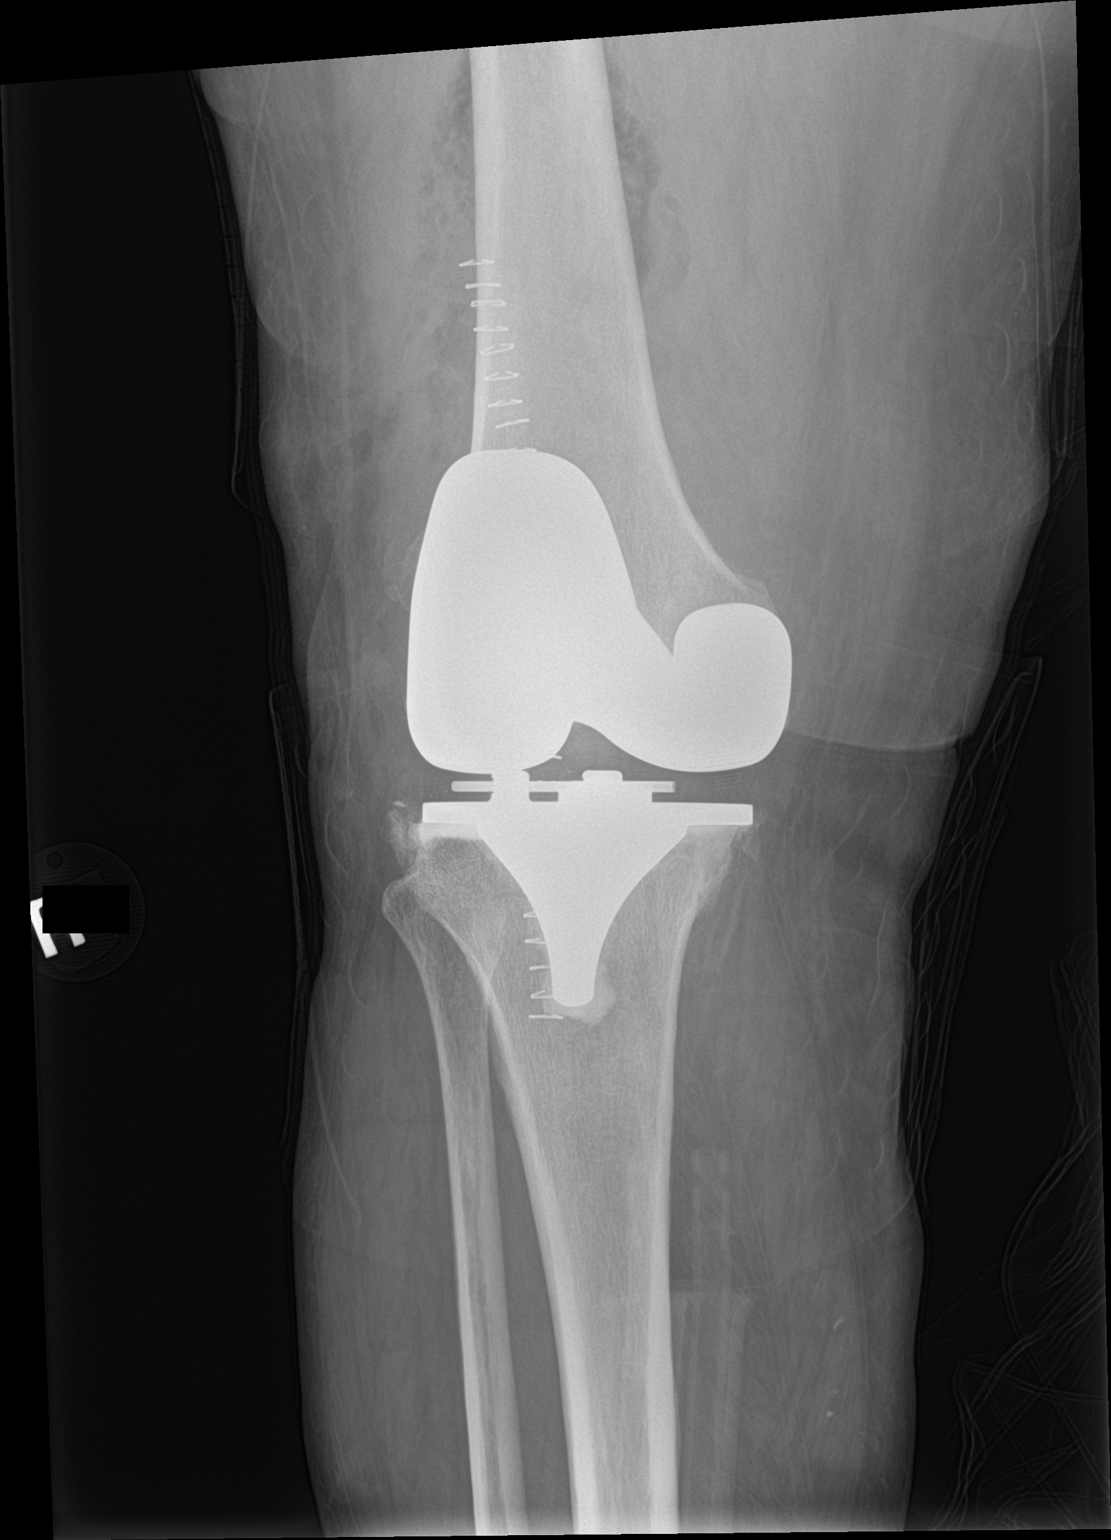

[knee lat]
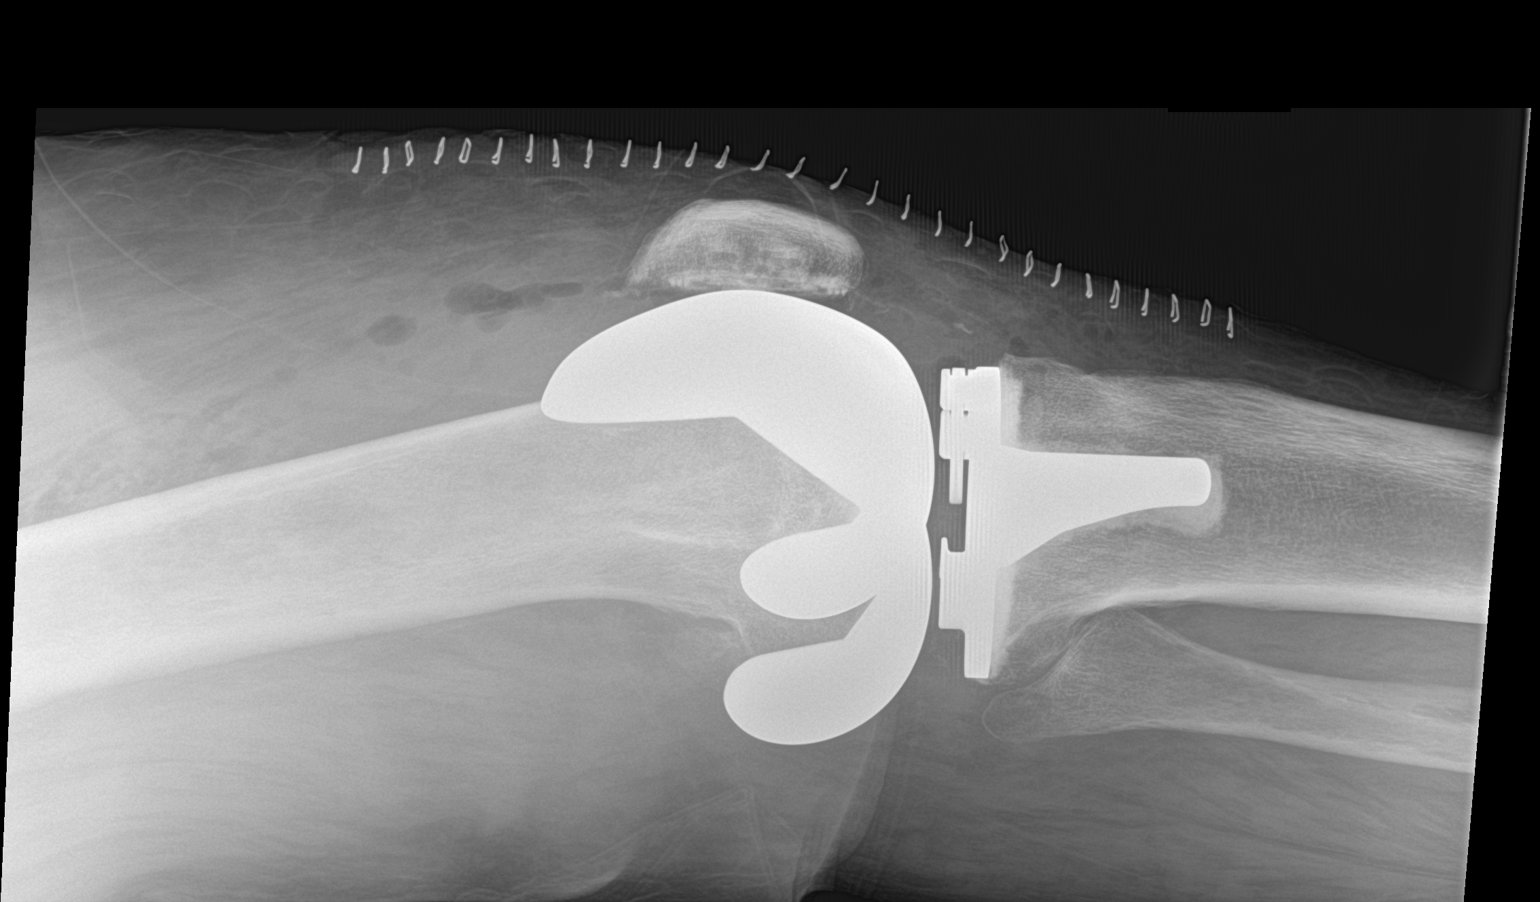

[2 of 2 positions shown; findings below may reference images not displayed]

FINDINGS: The right femoral and tibial components appear to be well situated.
Expected postoperative changes are seen in the soft tissues
anteriorly. No fracture or dislocation is noted.
IMPRESSION: Status post right total knee arthroplasty.

## 2020-01-23 ENCOUNTER — Ambulatory Visit: Payer: Medicaid Other | Attending: Internal Medicine

## 2020-01-23 DIAGNOSIS — Z20822 Contact with and (suspected) exposure to covid-19: Secondary | ICD-10-CM

## 2020-01-24 LAB — NOVEL CORONAVIRUS, NAA: SARS-CoV-2, NAA: NOT DETECTED

## 2020-04-11 IMAGING — MG DIGITAL DIAGNOSTIC BILAT W/ TOMO W/ CAD
6 of 12 series · 6 of 36 positions shown · non-contrast
Comparison: Previous exam(s).

CLINICAL DATA: Delayed follow-up of a probable benign mass in the
right breast originally seen on the patient's screening mammogram
dated 09/29/2018.

EXAM:
DIGITAL DIAGNOSTIC BILATERAL MAMMOGRAM WITH CAD AND TOMO
ULTRASOUND RIGHT BREAST

[R CC synth-2D]
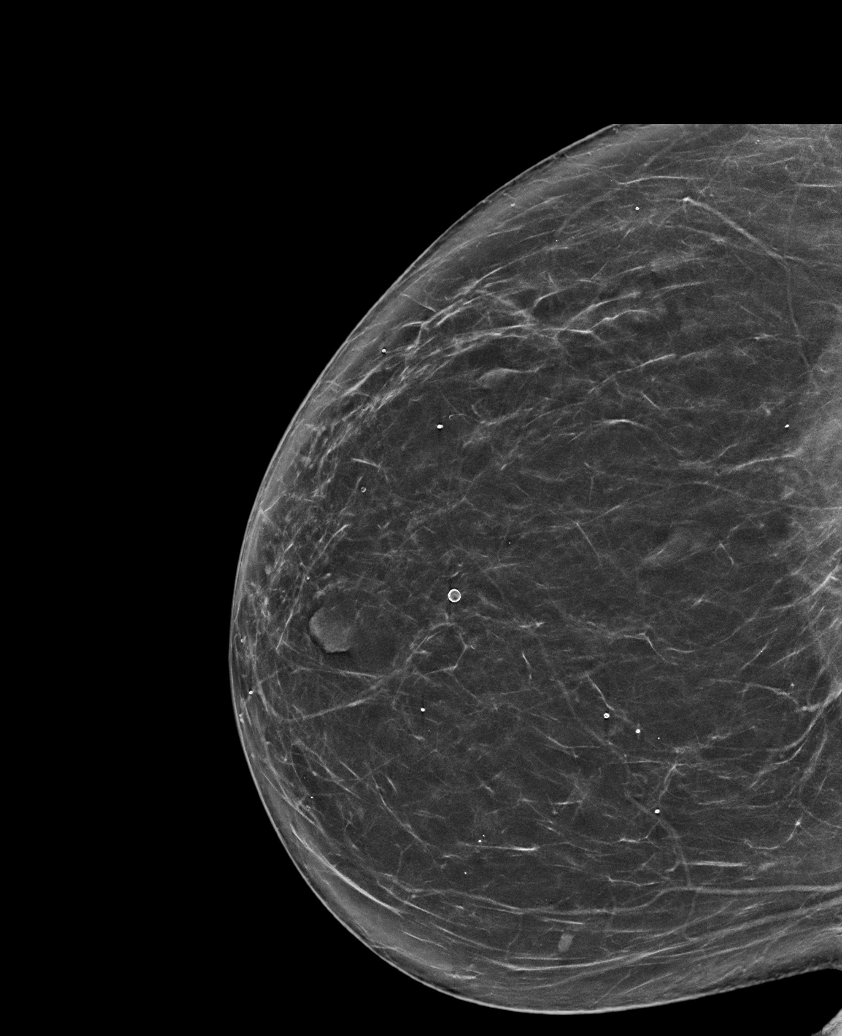

[L MLO synth-2D (1 of 2)]
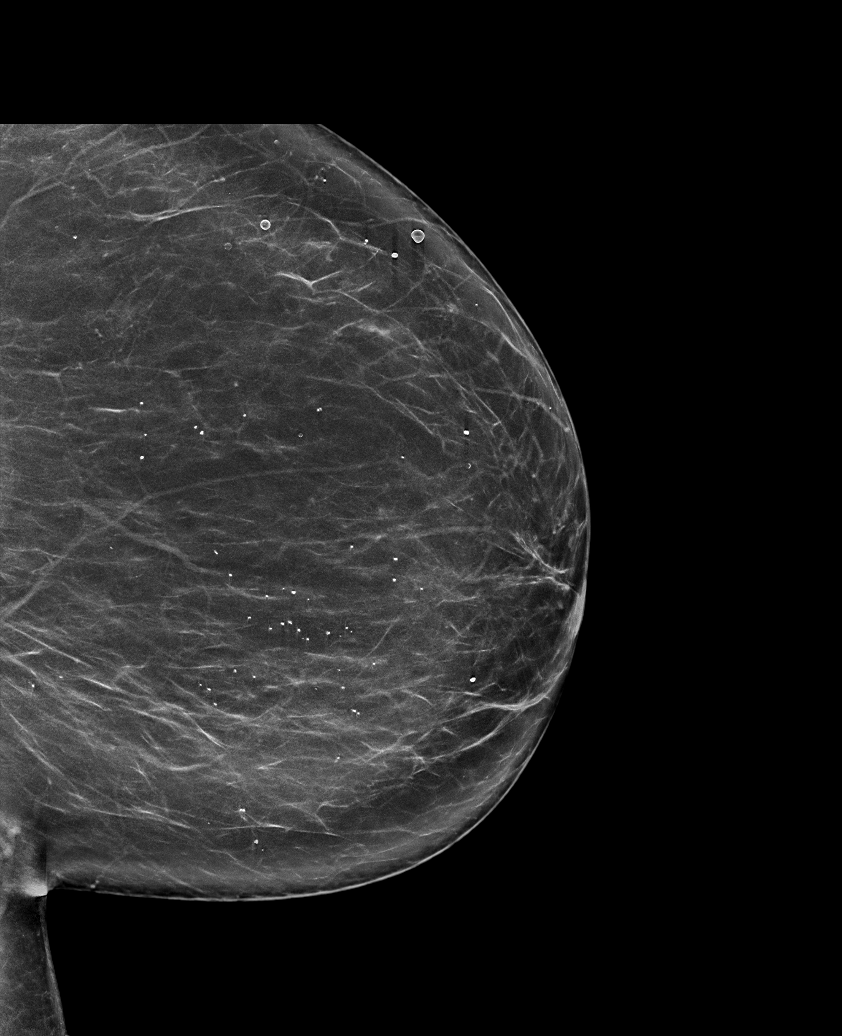

[L CC synth-2D]
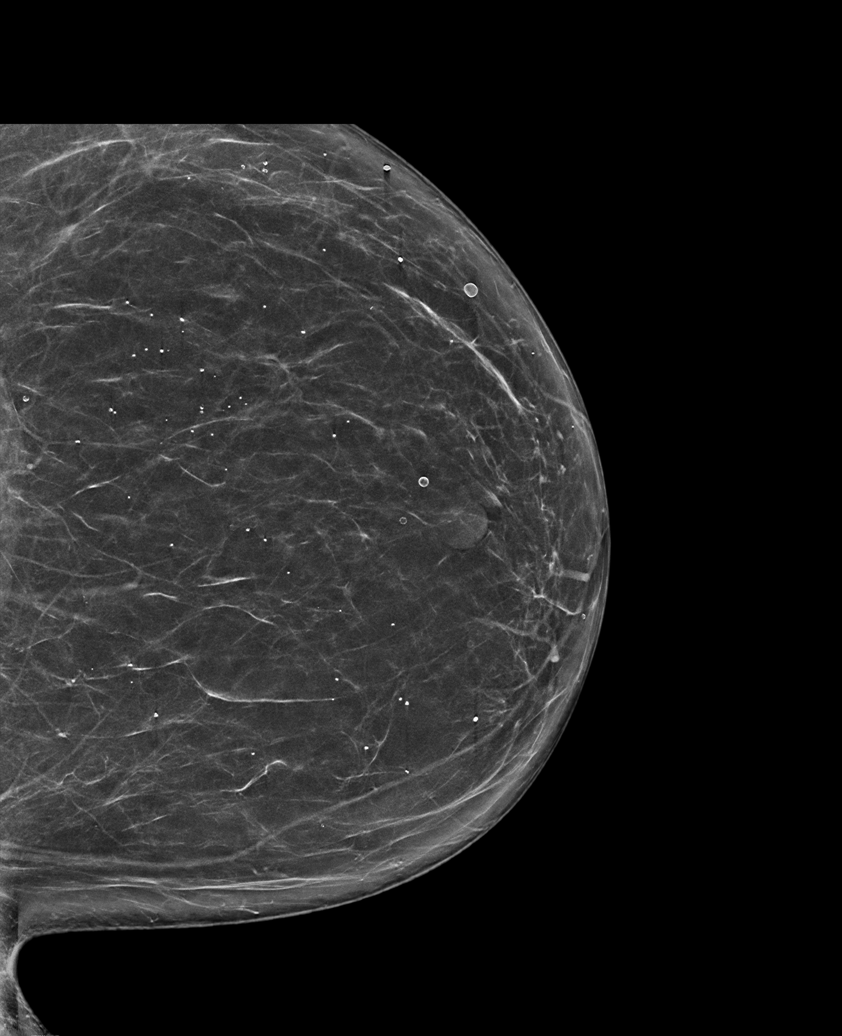

[R MLO synth-2D (1 of 2)]
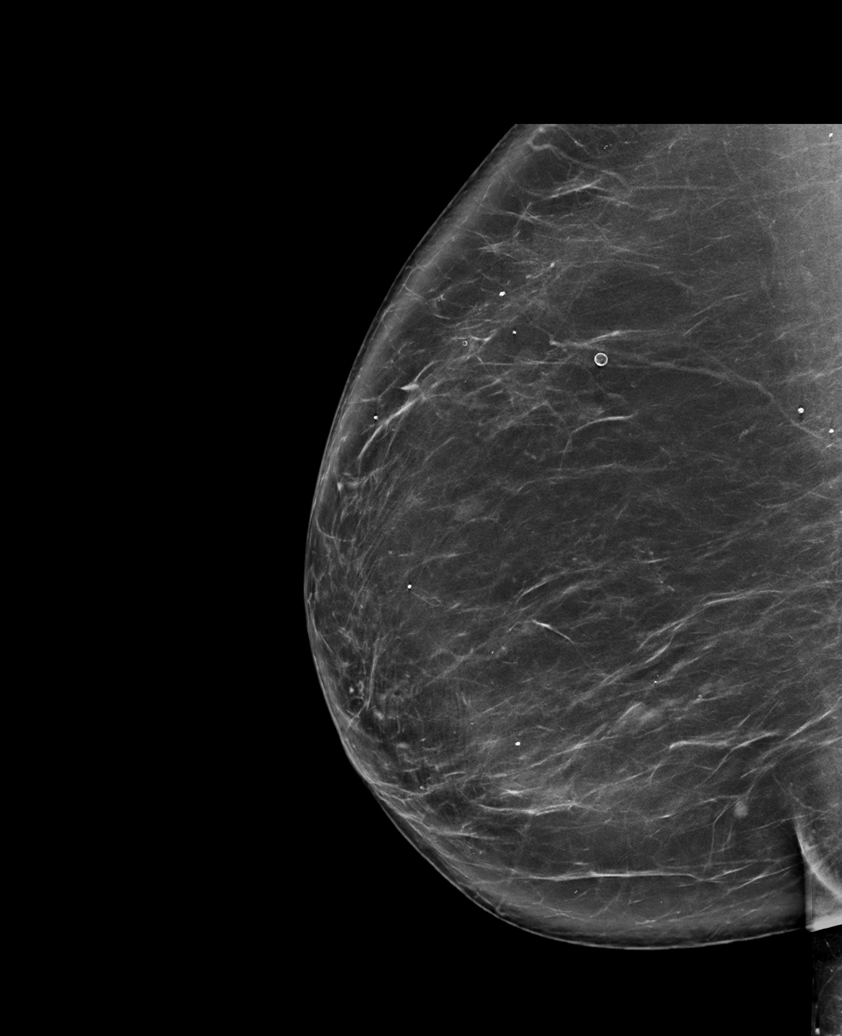

[L MLO synth-2D (2 of 2)]
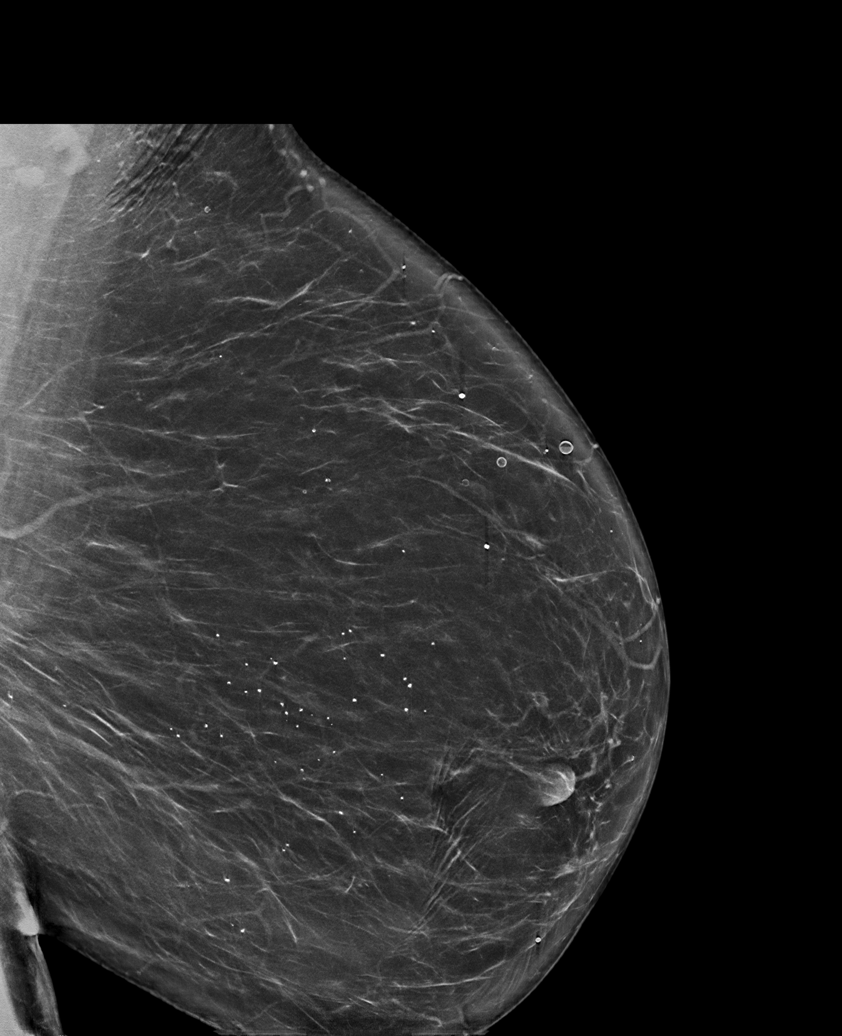

[R MLO synth-2D (2 of 2)]
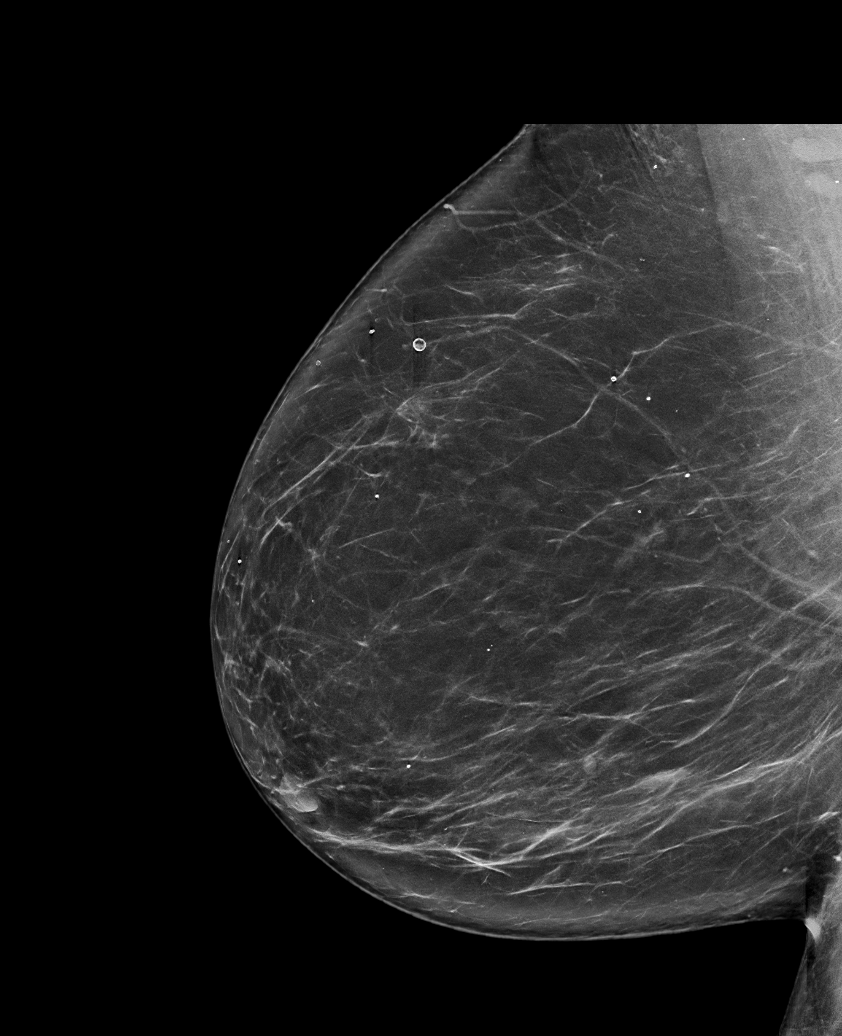

[6 of 36 positions shown; findings below may reference images not displayed]

ACR Breast Density Category b: There are scattered areas of
fibroglandular density.
FINDINGS: The 4 x 2 mm mass in the lower inner quadrant of the right breast is
stable compared to the prior exam dated 09/29/2018. No suspicious
mass or malignant type microcalcifications identified in either
breast.

Mammographic images were processed with CAD.

On physical exam, I do not palpate a mass in the lower-inner
quadrant of the right breast.

Targeted ultrasound is performed, showing normal tissue at [DATE] 8 cm
from the nipple. The previously seen mass in this area is no longer
visualized.
IMPRESSION: Probable benign mass in the lower-inner quadrant of the right
breast.

RECOMMENDATION:
Bilateral diagnostic mammogram in 1 year is recommended to document
stability of the probable benign right breast mass for 2+ years.

I have discussed the findings and recommendations with the patient.
If applicable, a reminder letter will be sent to the patient
regarding the next appointment.

BI-RADS CATEGORY  3: Probably benign.

## 2020-04-30 ENCOUNTER — Encounter: Payer: Self-pay | Admitting: Gastroenterology

## 2020-04-30 ENCOUNTER — Other Ambulatory Visit: Payer: Self-pay

## 2020-04-30 ENCOUNTER — Telehealth: Payer: Self-pay | Admitting: Gastroenterology

## 2020-04-30 ENCOUNTER — Ambulatory Visit (INDEPENDENT_AMBULATORY_CARE_PROVIDER_SITE_OTHER): Payer: Medicaid Other | Admitting: Gastroenterology

## 2020-04-30 VITALS — BP 133/94 | HR 85 | Temp 98.1°F | Ht 66.0 in | Wt 262.0 lb

## 2020-04-30 DIAGNOSIS — K219 Gastro-esophageal reflux disease without esophagitis: Secondary | ICD-10-CM | POA: Diagnosis not present

## 2020-04-30 MED ORDER — OMEPRAZOLE 40 MG PO CPDR: 40.0000 mg | DELAYED_RELEASE_CAPSULE | Freq: Two times a day (BID) | ORAL | 1 refills | Status: DC

## 2020-04-30 MED ORDER — OMEPRAZOLE 40 MG PO CPDR: 40.0000 mg | DELAYED_RELEASE_CAPSULE | Freq: Two times a day (BID) | ORAL | 1 refills | Status: AC

## 2020-04-30 NOTE — Progress Notes (Signed)
Jonathon Bellows MD, MRCP(U.K) [address]  [place]  [place], [place] [address]  Main: [phone]  Fax: [phone]   Gastroenterology Consultation  Referring Provider:     Gennette Pac, FNP Primary Care Physician:  Gennette Pac, [place] Primary Gastroenterologist:  Dr. Jonathon Bellows  Reason for Consultation:     GERD        HPI:   Aimee Buck is a 59 y.o. y/o female referred for GERD.  In October 2020 I performed a screening colonoscopy for her.  A 6 mm polyp was found in the ascending colon and 6 of the polyps were also seen in the ascending colon which were less than 6 mm in size that were resected.  There were all sessile serrated polyps.  She will need a repeat colonoscopy in 3 years.Presently she has been referred for acid reflux.  Reflux:  Onset : A few months back Symptoms: Heartburn at the night time as well as in the morning Recent weight gain: Yes Medications: Omeprazole 20 mg a day Narcotics or anticholinergics use : Yes PPI /H2 blockers or Antacid  use and timing : Omeprazole 20 mg a day in the morning before breakfast Dinner time : Has been having dinner before bedtime Prior EGD: No    Past Medical History:  Diagnosis Date  . Asthma   . COPD (chronic obstructive pulmonary disease) ([place])   . Diabetes ([place])    diet controlled per pt 07/24/19  . GERD (gastroesophageal reflux disease)   . Hypertension     Past Surgical History:  Procedure Laterality Date  . ANKLE SURGERY Left   . COLONOSCOPY WITH PROPOFOL N/A 10/19/2019   Procedure: COLONOSCOPY WITH PROPOFOL;  Surgeon: Jonathon Bellows, MD;  [facility] END[facility];  Service: Gastroenterology;  Laterality: N/A;  . DENTAL SURGERY    . JOINT REPLACEMENT Left 10/2018  . TOTAL KNEE ARTHROPLASTY Left 11/28/2018   Procedure: TOTAL KNEE ARTHROPLASTY;  Surgeon: Corky Mull, MD;  Location: [facility] ORS;  Service: Orthopedics;  Laterality: Left;  . TOTAL KNEE ARTHROPLASTY Right 07/31/2019   Procedure: RIGHT  TOTAL KNEE ARTHROPLASTY;  Surgeon: Corky Mull, MD;  Location: [facility] ORS;  Service: Orthopedics;  Laterality: Right;    Prior to Admission medications   Medication Sig Start Date End Date Taking? Authorizing Provider  allopurinol (ZYLOPRIM) 100 MG tablet Take 100 mg by mouth daily.   Yes [provider]  aspirin EC 81 MG tablet Take 81 mg by mouth daily.   Yes [provider]  CALCIUM PO Take 1 tablet by mouth daily.   Yes [provider]  Fluticasone-Salmeterol (ADVAIR) 100-50 MCG/DOSE AEPB Inhale 1 puff into the lungs daily as needed (shortness of breath).    Yes [provider]  hydrochlorothiazide (MICROZIDE) 12.5 MG capsule Take 12.5 mg by mouth daily.   Yes [provider]  lisinopril (ZESTRIL) 20 MG tablet Take 20 mg by mouth daily.   Yes [provider]  oxyCODONE (OXY IR/ROXICODONE) 5 MG immediate release tablet Take 1-2 tablets (5-10 mg total) by mouth every 4 (four) hours as needed for moderate pain (pain score 4-6). 08/01/19  Yes Duanne Guess, PA-C    Family History  Problem Relation Age of Onset  . Hypertension Mother   . Hypertension Father   . CAD Father   . Heart failure Father   . Breast cancer Neg Hx      Social History   Tobacco Use  . Smoking status: Current Every  Day Smoker    Packs/day: 0.50    Types: Cigarettes  . Smokeless tobacco: Never Used  . Tobacco comment: patient trying to quit smoking   Substance Use Topics  . Alcohol use: Yes    Comment: a few shots  . Drug use: No    Allergies as of 04/30/2020 - Review Complete 04/30/2020  Allergen Reaction Noted  . Metrizamide Itching and Swelling 03/09/2017  . Contrast media [iodinated diagnostic agents] Itching and Swelling 03/09/2017    Review of Systems:    All systems reviewed and negative except where noted in HPI.   Physical Exam:  BP (!) 133/94 (BP Location: Right Arm, Patient Position: Sitting, Cuff Size: Large)   Pulse 85   Temp 98.1  F (36.7 C) (Oral)   Ht 5\' 6"  (1.676 m)   Wt 262 lb (118.8 kg)   BMI 42.29 kg/m  No LMP recorded. Patient is postmenopausal. Psych:  Alert and cooperative. Normal mood and affect. General:   Alert,  Well-developed, well-nourished, pleasant and cooperative in NAD Head:  Normocephalic and atraumatic. Eyes:  Sclera clear, no icterus.   Conjunctiva pink. Lungs:  Respirations even and unlabored.  Clear throughout to auscultation.   No wheezes, crackles, or rhonchi. No acute distress. Heart:  Regular rate and rhythm; no murmurs, clicks, rubs, or gallops. Abdomen:  Normal bowel sounds.  No bruits.  Soft, non-tender and non-distended without masses, hepatosplenomegaly or hernias noted.  No guarding or rebound tenderness.    Neurologic:  Alert and oriented x3;  grossly normal neurologically. Psych:  Alert and cooperative. Normal mood and affect.  Imaging Studies: No results found.  Assessment and Plan:   [name] is a 59 y.o. y/o female has been referred for GERD.  GERD : Counseled on life style changes, suggest to use PPI first thing in the morning on empty stomach and eat 30 minutes after. Advised on the use of a wedge pillow at night , avoid meals for 2 hours prior to bed time. Weight loss.  Will increase the dose of PPI from 20 mg a day to 40 mg twice daily as she is quite uncomfortable with the heartburn.. Aim to use at the lowest dose for the shortest period of time.  I will call her up in 6 weeks time to discuss how she is doing and if better we will plan to decrease the dose gradually.  She is considering weight loss surgery.    Follow up in 6 weeks telephone visit  Dr Jonathon Bellows MD,MRCP(U.K)

## 2020-04-30 NOTE — Telephone Encounter (Signed)
Patient called and needs medication sent over to [facility] in [place]

## 2020-04-30 NOTE — Patient Instructions (Signed)

## 2020-06-11 ENCOUNTER — Telehealth (INDEPENDENT_AMBULATORY_CARE_PROVIDER_SITE_OTHER): Payer: Medicaid Other | Admitting: Gastroenterology

## 2020-06-11 DIAGNOSIS — Z5329 Procedure and treatment not carried out because of patient's decision for other reasons: Secondary | ICD-10-CM

## 2020-06-11 NOTE — Progress Notes (Signed)
No show

## 2020-06-19 DEATH — deceased
